# Patient Record
Sex: Male | Born: 1945 | Race: White | Hispanic: No | Smoking: Never smoker
Health system: Southern US, Community
[De-identification: ages and names within clinical notes are randomized; demographics above are authoritative.]

## PROBLEM LIST (undated history)

## (undated) DIAGNOSIS — G4730 Sleep apnea, unspecified: Secondary | ICD-10-CM

## (undated) DIAGNOSIS — F419 Anxiety disorder, unspecified: Secondary | ICD-10-CM

## (undated) DIAGNOSIS — K589 Irritable bowel syndrome without diarrhea: Secondary | ICD-10-CM

## (undated) DIAGNOSIS — F32A Depression, unspecified: Secondary | ICD-10-CM

## (undated) DIAGNOSIS — D649 Anemia, unspecified: Secondary | ICD-10-CM

## (undated) DIAGNOSIS — F429 Obsessive-compulsive disorder, unspecified: Secondary | ICD-10-CM

## (undated) DIAGNOSIS — I10 Essential (primary) hypertension: Secondary | ICD-10-CM

## (undated) DIAGNOSIS — E7800 Pure hypercholesterolemia, unspecified: Secondary | ICD-10-CM

## (undated) DIAGNOSIS — C61 Malignant neoplasm of prostate: Secondary | ICD-10-CM

## (undated) HISTORY — DX: Obsessive-compulsive disorder, unspecified: F42.9

## (undated) HISTORY — PX: TONSILECTOMY, ADENOIDECTOMY, BILATERAL MYRINGOTOMY AND TUBES: SHX2538

## (undated) HISTORY — DX: Pure hypercholesterolemia, unspecified: E78.00

## (undated) HISTORY — PX: COLONOSCOPY: SHX174

## (undated) HISTORY — DX: Anxiety disorder, unspecified: F41.9

## (undated) HISTORY — DX: Malignant neoplasm of prostate: C61

## (undated) HISTORY — DX: Essential (primary) hypertension: I10

## (undated) HISTORY — DX: Irritable bowel syndrome, unspecified: K58.9

---

## 2022-02-03 ENCOUNTER — Encounter: Payer: Self-pay | Admitting: Family Medicine

## 2022-02-03 ENCOUNTER — Ambulatory Visit (INDEPENDENT_AMBULATORY_CARE_PROVIDER_SITE_OTHER): Payer: Medicare Other | Admitting: Family Medicine

## 2022-02-03 VITALS — BP 128/88 | HR 113 | Temp 96.2°F | Ht 70.0 in | Wt 147.6 lb

## 2022-02-03 DIAGNOSIS — C61 Malignant neoplasm of prostate: Secondary | ICD-10-CM | POA: Diagnosis not present

## 2022-02-03 DIAGNOSIS — K4090 Unilateral inguinal hernia, without obstruction or gangrene, not specified as recurrent: Secondary | ICD-10-CM | POA: Diagnosis not present

## 2022-02-03 DIAGNOSIS — K581 Irritable bowel syndrome with constipation: Secondary | ICD-10-CM

## 2022-02-03 DIAGNOSIS — I10 Essential (primary) hypertension: Secondary | ICD-10-CM

## 2022-02-03 DIAGNOSIS — F429 Obsessive-compulsive disorder, unspecified: Secondary | ICD-10-CM

## 2022-02-03 DIAGNOSIS — R7309 Other abnormal glucose: Secondary | ICD-10-CM

## 2022-02-03 DIAGNOSIS — E7849 Other hyperlipidemia: Secondary | ICD-10-CM | POA: Diagnosis not present

## 2022-02-03 DIAGNOSIS — R109 Unspecified abdominal pain: Secondary | ICD-10-CM | POA: Diagnosis not present

## 2022-02-03 DIAGNOSIS — K589 Irritable bowel syndrome without diarrhea: Secondary | ICD-10-CM | POA: Insufficient documentation

## 2022-02-03 DIAGNOSIS — R300 Dysuria: Secondary | ICD-10-CM

## 2022-02-03 DIAGNOSIS — F419 Anxiety disorder, unspecified: Secondary | ICD-10-CM

## 2022-02-03 LAB — POCT URINALYSIS DIPSTICK
Bilirubin, UA: NEGATIVE
Blood, UA: NEGATIVE
Glucose, UA: NEGATIVE
Ketones, UA: NEGATIVE
Nitrite, UA: NEGATIVE
Protein, UA: POSITIVE — AB
Spec Grav, UA: >=1.03 — AB (ref 1.010–1.025)
Urobilinogen, UA: 0.2 E.U./dL
pH, UA: 5 (ref 5.0–8.0)

## 2022-02-03 NOTE — Patient Instructions (Signed)
Take Lorazepam 2-3 times /day ?

## 2022-02-03 NOTE — Progress Notes (Signed)
? ? ?Provider:  ?Alain Honey, MD ? ?Careteam: ?Patient Care Team: ?Wardell Honour, MD as PCP - General (Family Medicine) ? ?PLACE OF SERVICE:  ?[facility]  ?Advanced Directive information ?  ? ?No Known Allergies ? ?Chief Complaint  ?Patient presents with  ? New Patient (Initial Visit)  ?  Patient presents today for a new patient appointment.  ? Acute Visit  ?  Patient report lower abdominal pain/ pressure for over 4 year now.   ? ? ? ?HPI: Patient is a 76 y.o. male patient lives in [place] most of his life.  His wife died about 1-1/2 years ago and he has lived some in [place] but mostly here in [place] with his son and daughter-in-law. ? ?He has history of prostate cancer 6 years ago treated with radiation but no recent PSA. ? ?Also has a history of depression and OCD and sees Dr. Reece Levy here in [place] for that.  Current medications include sertraline and Abilify as well as lorazepam for anxiety. ? ?He has an chronic ongoing problems with obsessive-compulsive disorder worrying about his bowels as well as urine.  He does have a hernia on the left side.  We will get general surgery to see if repair might be indicated. ?He is not driving.  There is some instability as far as his feet as well as cognition. ? ?Review of Systems:  ?Review of Systems  ?Constitutional:  Positive for weight loss.  ?HENT: Negative.    ?Respiratory: Negative.    ?Cardiovascular: Negative.   ?Gastrointestinal:  Positive for constipation.  ?     Unsure if he is really constipated or it is really more of an obsession  ?Musculoskeletal: Negative.   ?Neurological: Negative.   ?Psychiatric/Behavioral:  Positive for depression and memory loss. The patient is nervous/anxious.   ?All other systems reviewed and are negative. ? ?Past Medical History:  ?Diagnosis Date  ? Anxiety   ? Per [place] new patient packet  ? High blood pressure   ? Per  ? High cholesterol   ? Per [place] new patient packet  ? IBS (irritable bowel syndrome)   ?  Per [place] new patient packet  ? OCD (obsessive compulsive disorder)   ? Per [place] new patient packet  ? Prostate cancer ([place])   ? Per [place] new patient packet  ? ?Past Surgical History:  ?Procedure Laterality Date  ? TONSILECTOMY, ADENOIDECTOMY, BILATERAL MYRINGOTOMY AND TUBES    ? Per [place] new patient packet  ? ?Social History: ?  reports that he has never smoked. He has never used smokeless tobacco. He reports that he does not drink alcohol and does not use drugs. ? ?Family History  ?Problem Relation Age of Onset  ? Dementia Mother   ? ? ?Medications: ?Patient's Medications  ?New Prescriptions  ? No medications on file  ?Previous Medications  ? ARIPIPRAZOLE (ABILIFY) 2 MG TABLET    Take 2 mg by mouth daily.  ? LORAZEPAM (ATIVAN) 0.5 MG TABLET    Take 0.5 mg by mouth every 8 (eight) hours.  ? METAMUCIL FIBER PO    Take 1 Dose by mouth daily.  ? SERTRALINE (ZOLOFT) 100 MG TABLET    Take 200 mg by mouth daily.  ? TAMSULOSIN (FLOMAX) 0.4 MG CAPS CAPSULE    Take 0.4 mg by mouth daily.  ?Modified Medications  ? No medications on file  ?Discontinued Medications  ? No medications on file  ? ? ?Physical Exam: ? ?Vitals:  ? 02/03/22 0844  ?  BP: 128/88  ?Pulse: (!) 113  ?Temp: (!) 96.2 ?F (35.7 ?C)  ?SpO2: 98%  ?Weight: 147 lb 9.6 oz (67 kg)  ?Height: '5\' 10"'$  (1.778 m)  ? ?Body mass index is 21.18 kg/m?. ?Wt Readings from Last 3 Encounters:  ?02/03/22 147 lb 9.6 oz (67 kg)  ? ? ?Physical Exam ?Vitals and nursing note reviewed.  ?Constitutional:   ?   Appearance: Normal appearance.  ?   Comments: Patient appears very fidgety during the course of the exam frequent mentioning of fear of obstruction of bowels and bladder  ?HENT:  ?   Head: Normocephalic.  ?   Right Ear: Tympanic membrane normal.  ?   Left Ear: Tympanic membrane normal.  ?Eyes:  ?   Extraocular Movements: Extraocular movements intact.  ?   Pupils: Pupils are equal, round, and reactive to light.  ?Cardiovascular:  ?   Rate and Rhythm: Normal rate and regular rhythm.  ?    Pulses: Normal pulses.  ?Pulmonary:  ?   Effort: Pulmonary effort is normal.  ?   Breath sounds: Normal breath sounds.  ?Abdominal:  ?   General: Abdomen is flat. Bowel sounds are normal.  ?   Palpations: Abdomen is soft.  ?Musculoskeletal:  ?   Cervical back: Normal range of motion.  ?Neurological:  ?   General: No focal deficit present.  ?   Mental Status: He is alert and oriented to person, place, and time.  ?   Comments: MMSE performed.  Patient scored 29 out of 30, but I feel like there is some soft evidence of cognitive impairment.  There is a general apathy that is unknown affected by the use of sertraline  ?Psychiatric:  ?   Comments: Behaviors are abnormal as noted above with restlessness and fidgeting and multiple mentions of relief of obstruction when exam does not support bowel blockage  ? ? ?Labs reviewed: ?Basic Metabolic Panel: ?No results for input(s): NA, K, CL, CO2, GLUCOSE, BUN, CREATININE, CALCIUM, MG, PHOS, TSH in the last 8760 hours. ?Liver Function Tests: ?No results for input(s): AST, ALT, ALKPHOS, BILITOT, PROT, ALBUMIN in the last 8760 hours. ?No results for input(s): LIPASE, AMYLASE in the last 8760 hours. ?No results for input(s): AMMONIA in the last 8760 hours. ?CBC: ?No results for input(s): WBC, NEUTROABS, HGB, HCT, MCV, PLT in the last 8760 hours. ?Lipid Panel: ?No results for input(s): CHOL, HDL, LDLCALC, TRIG, CHOLHDL, LDLDIRECT in the last 8760 hours. ?TSH: ?No results for input(s): TSH in the last 8760 hours. ?A1C: ?No results found for: HGBA1C ? ? ?Assessment/Plan ? ?1. Prostate cancer ([place]) ?Diagnosis 6 years ago.  Status post radiation treatments but no recent check of PSA ? ? ? ?3. Other hyperlipidemia ?History of hyperlipidemia but on no medications.  Need to establish baseline today ? ?4. Hernia, inguinal, left ?Exam demonstrates hernia.  Will refer to general surgery for their opinion ? ?5. Abdominal pain, unspecified abdominal location ?Abdominal exam is completely normal.   Complaints are chronic.  He is on MiraLAX and I have suggested increasing dose from 1 tablespoon to 2 table spoons or 3 tablespoons as needed ? ?6. Dysuria ?There are few white cells on urine today.  Culture it seems indicated ? ?7. Anxiety ?Anxiety demonstrated in examining room.  Have suggested increasing lorazepam 0.5 mg to 2 or 3 times a day ? ?8. Hypertension, unspecified type ?Blood pressure is good at 128/88 ? ?9. Irritable bowel syndrome with constipation ?Part of OCD.  Being treated  with sertraline 200 mg ? ?10. Obsessive-compulsive disorder, unspecified type ?As above treated with sertraline but clomipramine might be a better choice will confer with his psychiatry ? ? ?Alain Honey, MD ?[facility] ?[phone]  ? ?

## 2022-02-04 LAB — COMPLETE METABOLIC PANEL WITH GFR
AG Ratio: 2.3 (calc) (ref 1.0–2.5)
ALT: 8 U/L — ABNORMAL LOW (ref 9–46)
AST: 14 U/L (ref 10–35)
Albumin: 4.6 g/dL (ref 3.6–5.1)
Alkaline phosphatase (APISO): 53 U/L (ref 35–144)
BUN/Creatinine Ratio: 16 (calc) (ref 6–22)
BUN: 21 mg/dL (ref 7–25)
CO2: 26 mmol/L (ref 20–32)
Calcium: 9.3 mg/dL (ref 8.6–10.3)
Chloride: 104 mmol/L (ref 98–110)
Creat: 1.35 mg/dL — ABNORMAL HIGH (ref 0.70–1.28)
Globulin: 2 g/dL (calc) (ref 1.9–3.7)
Glucose, Bld: 95 mg/dL (ref 65–99)
Potassium: 3.9 mmol/L (ref 3.5–5.3)
Sodium: 141 mmol/L (ref 135–146)
Total Bilirubin: 0.5 mg/dL (ref 0.2–1.2)
Total Protein: 6.6 g/dL (ref 6.1–8.1)
eGFR: 55 mL/min/{1.73_m2} — ABNORMAL LOW (ref >=60)

## 2022-02-04 LAB — URINE CULTURE
MICRO NUMBER:: 13315905
Result:: NO GROWTH
SPECIMEN QUALITY:: ADEQUATE

## 2022-02-04 LAB — LIPID PANEL
Cholesterol: 211 mg/dL — ABNORMAL HIGH (ref <200)
HDL: 56 mg/dL (ref >=40)
LDL Cholesterol (Calc): 134 mg/dL (calc) — ABNORMAL HIGH
Non-HDL Cholesterol (Calc): 155 mg/dL (calc) — ABNORMAL HIGH (ref <130)
Total CHOL/HDL Ratio: 3.8 (calc) (ref <5.0)
Triglycerides: 99 mg/dL (ref <150)

## 2022-02-04 LAB — PSA, TOTAL AND FREE
PSA, % Free: 33 % (calc) (ref >25)
PSA, Free: 0.1 ng/mL
PSA, Total: 0.3 ng/mL (ref <=4.0)

## 2022-02-09 ENCOUNTER — Encounter (HOSPITAL_COMMUNITY): Payer: Self-pay

## 2022-02-09 ENCOUNTER — Other Ambulatory Visit: Payer: Self-pay

## 2022-02-09 ENCOUNTER — Emergency Department (HOSPITAL_COMMUNITY)
Admission: EM | Admit: 2022-02-09 | Discharge: 2022-02-10 | Disposition: A | Discharge status: Home / Self Care | Payer: Medicare Other | Attending: Emergency Medicine | Admitting: Emergency Medicine

## 2022-02-09 DIAGNOSIS — R4182 Altered mental status, unspecified: Secondary | ICD-10-CM | POA: Insufficient documentation

## 2022-02-09 DIAGNOSIS — Z8546 Personal history of malignant neoplasm of prostate: Secondary | ICD-10-CM | POA: Diagnosis not present

## 2022-02-09 DIAGNOSIS — R339 Retention of urine, unspecified: Secondary | ICD-10-CM | POA: Diagnosis present

## 2022-02-09 DIAGNOSIS — N179 Acute kidney failure, unspecified: Secondary | ICD-10-CM | POA: Insufficient documentation

## 2022-02-09 DIAGNOSIS — E860 Dehydration: Secondary | ICD-10-CM | POA: Diagnosis not present

## 2022-02-09 LAB — COMPREHENSIVE METABOLIC PANEL
ALT: 13 U/L (ref 0–44)
AST: 23 U/L (ref 15–41)
Albumin: 4.6 g/dL (ref 3.5–5.0)
Alkaline Phosphatase: 51 U/L (ref 38–126)
Anion gap: 10 (ref 5–15)
BUN: 24 mg/dL — ABNORMAL HIGH (ref 8–23)
CO2: 25 mmol/L (ref 22–32)
Calcium: 9.3 mg/dL (ref 8.9–10.3)
Chloride: 101 mmol/L (ref 98–111)
Creatinine, Ser: 1.5 mg/dL — ABNORMAL HIGH (ref 0.61–1.24)
GFR, Estimated: 49 mL/min — ABNORMAL LOW (ref >60)
Glucose, Bld: 98 mg/dL (ref 70–99)
Potassium: 4.1 mmol/L (ref 3.5–5.1)
Sodium: 136 mmol/L (ref 135–145)
Total Bilirubin: 1 mg/dL (ref 0.3–1.2)
Total Protein: 7.7 g/dL (ref 6.5–8.1)

## 2022-02-09 LAB — CBC
HCT: 44.1 % (ref 39.0–52.0)
Hemoglobin: 14.5 g/dL (ref 13.0–17.0)
MCH: 29.3 pg (ref 26.0–34.0)
MCHC: 32.9 g/dL (ref 30.0–36.0)
MCV: 89.1 fL (ref 80.0–100.0)
Platelets: 194 10*3/uL (ref 150–400)
RBC: 4.95 MIL/uL (ref 4.22–5.81)
RDW: 14.6 % (ref 11.5–15.5)
WBC: 10.8 10*3/uL — ABNORMAL HIGH (ref 4.0–10.5)
nRBC: 0 % (ref 0.0–0.2)

## 2022-02-09 LAB — URINALYSIS, ROUTINE W REFLEX MICROSCOPIC
Bilirubin Urine: NEGATIVE
Glucose, UA: NEGATIVE mg/dL
Hgb urine dipstick: NEGATIVE
Ketones, ur: 5 mg/dL — AB
Leukocytes,Ua: NEGATIVE
Nitrite: NEGATIVE
Protein, ur: NEGATIVE mg/dL
Specific Gravity, Urine: 1.013 (ref 1.005–1.030)
pH: 5 (ref 5.0–8.0)

## 2022-02-09 NOTE — ED Provider Triage Note (Signed)
Emergency Medicine Provider Triage Evaluation Note ? ?Miachel Roux , a 76 y.o. male  was evaluated in triage.  Pt complains of difficulty with urinating and diarrhea. Pt states he has been having constipation and started taking miralax multiple times per day and giving himself diarrhea. No blood in stool. He's complaining of difficulty starting urinary stream and emptying. Hx of prostate cancer requiring radiation.  ? ?Review of Systems  ?Positive: As above ?Negative: Fever, N/V, hematuria ? ?Physical Exam  ?BP (!) 141/87 (BP Location: Left Arm)   Pulse (!) 101   Temp 97.8 ?F (36.6 ?C) (Oral)   Resp 16   SpO2 97%  ?Gen:   Awake, no distress   ?Resp:  Normal effort  ?MSK:   Moves extremities without difficulty  ?Other:   ? ?Medical Decision Making  ?Medically screening exam initiated at 9:39 PM.  Appropriate orders placed.  Braydin Aloi was informed that the remainder of the evaluation will be completed by another provider, this initial triage assessment does not replace that evaluation, and the importance of remaining in the ED until their evaluation is complete. ? ? ?  ?Mayur Duman T, PA-C ?02/09/22 2140 ? ?

## 2022-02-09 NOTE — ED Triage Notes (Signed)
Pt BIB EMS. Pt has a hx of thinking that he has urinary retention and bowel blockage due to his anxiety from prostate cancer per his son. Pt had a recent appt and does not have urinary retention or a bowel blockage.  ?

## 2022-02-10 ENCOUNTER — Emergency Department (HOSPITAL_COMMUNITY): Payer: Medicare Other

## 2022-02-10 DIAGNOSIS — E860 Dehydration: Secondary | ICD-10-CM | POA: Diagnosis not present

## 2022-02-10 NOTE — ED Notes (Signed)
Bladder scanned pt, pt has 152 mL of urine retained in bladder. ?

## 2022-02-10 NOTE — ED Provider Notes (Signed)
?[facility] DEPT ?Provider Note ? ? ?CSN: [id number] ?Arrival date & time: 02/09/22  2118 ? ?  ? ?History ? ?Chief Complaint  ?Patient presents with  ? Urinary Retention  ? ? ?Kenneth Cunningham is a 76 y.o. male. ? ?76 year old male without significant past medical history who presents to the ER today secondary to concern for urinary or bowel obstruction.  Apparently has been perseverating on the concerned that he might get his bowels or urine blocked up.  He does have a history of prostate cancer status post radiation.  He feels like he is urinating too often.  He does not feel he is emptying his bladder.  He states he feels like he is constipated a while back to start taking MiraLAX every day but now is concerned because he is having multiple diarrhea episodes (67) most days.  Establish care with a primary doctor about a week ago who said his kidney function was a bit off which is gotten more worried.  Son states that he also has been having some intermittent episodes of balance problems especially when he is outside or standing up for a long time.  Also states that he had an episode earlier tonight where he was confused and did not know for sure what room he was then or what to do in certain rooms to include the bathroom.  No other neurologic changes.  Patient has memory issues and on reviewing the note from his primary doctor I suspect the patient might have some early dementia.  Also has increased stress in his life since his wife passed away about a year and a half ago when she was the one that took care of him.  He is now staying with his son. ? ? ? ?  ? ?Home Medications ?Prior to Admission medications   ?Medication Sig Start Date End Date Taking? Authorizing Provider  ?LORazepam (ATIVAN) 0.5 MG tablet Take 0.5 mg by mouth daily as needed for anxiety.   Yes [provider]  ?risperiDONE (RISPERDAL) 0.25 MG tablet Take 0.25 mg by mouth at bedtime. 02/04/22  Yes [provider]  ?sertraline (ZOLOFT) 100 MG tablet Take 200 mg by mouth at bedtime.   Yes [provider]  ?tamsulosin (FLOMAX) 0.4 MG CAPS capsule Take 0.4 mg by mouth every evening.   Yes [provider]  ?   ? ?Allergies    ?Patient has no known allergies.   ? ?Review of Systems   ?Review of Systems ? ?Physical Exam ?Updated Vital Signs ?BP 112/76   Pulse 81   Temp 97.8 ?F (36.6 ?C) (Oral)   Resp 16   SpO2 99%  ?Physical Exam ?Vitals and nursing note reviewed.  ?Constitutional:   ?   Appearance: He is well-developed.  ?HENT:  ?   Head: Normocephalic and atraumatic.  ?   Mouth/Throat:  ?   Mouth: Mucous membranes are moist.  ?   Pharynx: Oropharynx is clear.  ?Eyes:  ?   Pupils: Pupils are equal, round, and reactive to light.  ?Cardiovascular:  ?   Rate and Rhythm: Normal rate.  ?Pulmonary:  ?   Effort: Pulmonary effort is normal. No respiratory distress.  ?Abdominal:  ?   General: Abdomen is flat. There is no distension.  ?   Tenderness: There is no abdominal tenderness.  ?Genitourinary: ?   Penis: Normal.   ?   Testes: Normal.  ?Musculoskeletal:     ?   General: No swelling. Normal  range of motion.  ?   Cervical back: Normal range of motion.  ?Skin: ?   General: Skin is warm and dry.  ?Neurological:  ?   General: No focal deficit present.  ?   Mental Status: He is alert.  ? ? ?ED Results / Procedures / Treatments   ?Labs ?(all labs ordered are listed, but only abnormal results are displayed) ?Labs Reviewed  ?CBC - Abnormal; Notable for the following components:  ?    Result Value  ? WBC 10.8 (*)   ? All other components within normal limits  ?COMPREHENSIVE METABOLIC PANEL - Abnormal; Notable for the following components:  ? BUN 24 (*)   ? Creatinine, Ser 1.50 (*)   ? GFR, Estimated 49 (*)   ? All other components within normal limits  ?URINALYSIS, ROUTINE W REFLEX MICROSCOPIC - Abnormal; Notable for the following components:  ? Ketones, ur 5 (*)   ? All other components within normal limits   ? ? ?EKG ?None ? ?Radiology ?CT Head Wo Contrast ? ?Result Date: 02/10/2022 ?CLINICAL DATA:  Altered mental status EXAM: CT HEAD WITHOUT CONTRAST TECHNIQUE: Contiguous axial images were obtained from the base of the skull through the vertex without intravenous contrast. RADIATION DOSE REDUCTION: This exam was performed according to the departmental dose-optimization program which includes automated exposure control, adjustment of the mA and/or kV according to patient size and/or use of iterative reconstruction technique. COMPARISON:  None Available. FINDINGS: Brain: No evidence of acute infarction, hemorrhage, hydrocephalus, extra-axial collection or mass lesion/mass effect. Vascular: No hyperdense vessel or unexpected calcification. Skull: Normal. Negative for fracture or focal lesion. Sinuses/Orbits: No acute finding. Other: None. IMPRESSION: No acute intracranial abnormality noted. Electronically Signed   By: Inez Catalina M.D.   On: 02/10/2022 03:38   ? ?Procedures ?Procedures  ? ? ?Medications Ordered in ED ?Medications - No data to display ? ?ED Course/ Medical Decision Making/ A&P ?  ?                        ?Medical Decision Making ?Amount and/or Complexity of Data Reviewed ?Radiology: ordered. ? ? ?Bladder scan with ~150 post void. Urine clear. Does have mild AKI. Suspect dehydration as cause for multiple symptoms. Does have a history prostate cancer, so head CT done to evaluate for any type of metastatic disease. Doesn't have any signs of cauda equina. ? ?Reassuring.  Patient is stable for discharge.  Urology referral placed.  Return here for new or worsening symptoms. ?  ?Final Clinical Impression(s) / ED Diagnoses ?Final diagnoses:  ?Dehydration  ? ? ?Rx / DC Orders ?ED Discharge Orders   ? ?      Ordered  ?  Ambulatory referral to Urology       ? 02/10/22 0437  ? ?  ?  ? ?  ? ? ?  ?[name], MD ?02/10/22 [phone] ? ?

## 2022-02-15 ENCOUNTER — Other Ambulatory Visit: Payer: Self-pay

## 2022-02-15 ENCOUNTER — Emergency Department (HOSPITAL_COMMUNITY)
Admission: EM | Admit: 2022-02-15 | Discharge: 2022-02-17 | Disposition: A | Discharge status: Other Acute Inpatient Hospital | Payer: Medicare Other | Attending: Emergency Medicine | Admitting: Emergency Medicine

## 2022-02-15 ENCOUNTER — Encounter (HOSPITAL_COMMUNITY): Payer: Self-pay

## 2022-02-15 DIAGNOSIS — Z8546 Personal history of malignant neoplasm of prostate: Secondary | ICD-10-CM | POA: Insufficient documentation

## 2022-02-15 DIAGNOSIS — Z046 Encounter for general psychiatric examination, requested by authority: Secondary | ICD-10-CM | POA: Diagnosis present

## 2022-02-15 DIAGNOSIS — R339 Retention of urine, unspecified: Secondary | ICD-10-CM | POA: Diagnosis not present

## 2022-02-15 DIAGNOSIS — Y900 Blood alcohol level of less than 20 mg/100 ml: Secondary | ICD-10-CM | POA: Diagnosis not present

## 2022-02-15 DIAGNOSIS — F22 Delusional disorders: Secondary | ICD-10-CM | POA: Diagnosis not present

## 2022-02-15 DIAGNOSIS — Z79899 Other long term (current) drug therapy: Secondary | ICD-10-CM | POA: Diagnosis not present

## 2022-02-15 DIAGNOSIS — F429 Obsessive-compulsive disorder, unspecified: Secondary | ICD-10-CM | POA: Insufficient documentation

## 2022-02-15 DIAGNOSIS — Z20822 Contact with and (suspected) exposure to covid-19: Secondary | ICD-10-CM | POA: Insufficient documentation

## 2022-02-15 LAB — COMPREHENSIVE METABOLIC PANEL
ALT: 14 U/L (ref 0–44)
AST: 22 U/L (ref 15–41)
Albumin: 4.7 g/dL (ref 3.5–5.0)
Alkaline Phosphatase: 51 U/L (ref 38–126)
Anion gap: 13 (ref 5–15)
BUN: 17 mg/dL (ref 8–23)
CO2: 21 mmol/L — ABNORMAL LOW (ref 22–32)
Calcium: 9.3 mg/dL (ref 8.9–10.3)
Chloride: 103 mmol/L (ref 98–111)
Creatinine, Ser: 1.14 mg/dL (ref 0.61–1.24)
GFR, Estimated: >60 mL/min (ref >60)
Glucose, Bld: 103 mg/dL — ABNORMAL HIGH (ref 70–99)
Potassium: 3.7 mmol/L (ref 3.5–5.1)
Sodium: 137 mmol/L (ref 135–145)
Total Bilirubin: 1.2 mg/dL (ref 0.3–1.2)
Total Protein: 7.5 g/dL (ref 6.5–8.1)

## 2022-02-15 LAB — URINALYSIS, ROUTINE W REFLEX MICROSCOPIC
Bilirubin Urine: NEGATIVE
Glucose, UA: NEGATIVE mg/dL
Hgb urine dipstick: NEGATIVE
Ketones, ur: NEGATIVE mg/dL
Leukocytes,Ua: NEGATIVE
Nitrite: NEGATIVE
Protein, ur: NEGATIVE mg/dL
Specific Gravity, Urine: 1.014 (ref 1.005–1.030)
pH: 5 (ref 5.0–8.0)

## 2022-02-15 LAB — CBC WITH DIFFERENTIAL/PLATELET
Abs Immature Granulocytes: 0.05 10*3/uL (ref 0.00–0.07)
Basophils Absolute: 0 10*3/uL (ref 0.0–0.1)
Basophils Relative: 1 %
Eosinophils Absolute: 0 10*3/uL (ref 0.0–0.5)
Eosinophils Relative: 0 %
HCT: 43.7 % (ref 39.0–52.0)
Hemoglobin: 14.5 g/dL (ref 13.0–17.0)
Immature Granulocytes: 1 %
Lymphocytes Relative: 10 %
Lymphs Abs: 0.8 10*3/uL (ref 0.7–4.0)
MCH: 29.4 pg (ref 26.0–34.0)
MCHC: 33.2 g/dL (ref 30.0–36.0)
MCV: 88.5 fL (ref 80.0–100.0)
Monocytes Absolute: 0.7 10*3/uL (ref 0.1–1.0)
Monocytes Relative: 9 %
Neutro Abs: 5.7 10*3/uL (ref 1.7–7.7)
Neutrophils Relative %: 79 %
Platelets: 156 10*3/uL (ref 150–400)
RBC: 4.94 MIL/uL (ref 4.22–5.81)
RDW: 14.8 % (ref 11.5–15.5)
WBC: 7.2 10*3/uL (ref 4.0–10.5)
nRBC: 0 % (ref 0.0–0.2)

## 2022-02-15 LAB — RAPID URINE DRUG SCREEN, HOSP PERFORMED
Amphetamines: NOT DETECTED
Barbiturates: NOT DETECTED
Benzodiazepines: NOT DETECTED
Cocaine: NOT DETECTED
Opiates: NOT DETECTED
Tetrahydrocannabinol: NOT DETECTED

## 2022-02-15 LAB — RESP PANEL BY RT-PCR (FLU A&B, COVID) ARPGX2
Influenza A by PCR: NEGATIVE
Influenza B by PCR: NEGATIVE
SARS Coronavirus 2 by RT PCR: NEGATIVE

## 2022-02-15 LAB — ETHANOL: Alcohol, Ethyl (B): <10 mg/dL (ref <10)

## 2022-02-15 MED ORDER — LORAZEPAM 0.5 MG PO TABS
0.5000 mg | ORAL_TABLET | Freq: Every day | ORAL | Status: DC | PRN
  Administered 2022-02-15 – 2022-02-16 (×2): 0.5 mg via ORAL
  Filled 2022-02-15 (×2): qty 1

## 2022-02-15 MED ORDER — RISPERIDONE 0.5 MG PO TABS
0.2500 mg | ORAL_TABLET | Freq: Every day | ORAL | Status: DC
  Administered 2022-02-15 – 2022-02-16 (×2): 0.25 mg via ORAL
  Filled 2022-02-15 (×2): qty 1

## 2022-02-15 MED ORDER — ACETAMINOPHEN 325 MG PO TABS: 650.0000 mg | ORAL_TABLET | ORAL | Status: DC | PRN

## 2022-02-15 MED ORDER — TAMSULOSIN HCL 0.4 MG PO CAPS
0.4000 mg | ORAL_CAPSULE | Freq: Every evening | ORAL | Status: DC
  Administered 2022-02-15 – 2022-02-16 (×2): 0.4 mg via ORAL
  Filled 2022-02-15 (×2): qty 1

## 2022-02-15 MED ORDER — SERTRALINE HCL 50 MG PO TABS
200.0000 mg | ORAL_TABLET | Freq: Every day | ORAL | Status: DC
Start: 2022-02-15 — End: 2022-02-17
  Administered 2022-02-15 – 2022-02-16 (×2): 200 mg via ORAL
  Filled 2022-02-15 (×2): qty 4

## 2022-02-15 NOTE — ED Notes (Signed)
Updated patients daughter in law, [place] on the recommendation of geri psych placement. She said if she can please be updated along the way with where the patients placement will be.  ?

## 2022-02-15 NOTE — BH Assessment (Signed)
Comprehensive Clinical Assessment (CCA) Note  02/15/2022 Kenneth Cunningham [id number]  Discharge Disposition: Roselyn Bering, NP, reviewed pt's chart and information and determined pt meets geri psych hospitalization criteria. Pt's referral information will be faxed out to multiple hospitals for potential placement. This information was relayed to pt's team at [phone].  The patient demonstrates the following risk factors for suicide: Chronic risk factors for suicide include: psychiatric disorder of OCD . Acute risk factors for suicide include: family or marital conflict, unemployment, social withdrawal/isolation, and loss (financial, interpersonal, professional). Protective factors for this patient include: positive social support and positive therapeutic relationship. Considering these factors, the overall suicide risk at this point appears to be low. Patient is not appropriate for outpatient follow up.  Therefore, a 1:2 sitter is recommended for suicide precautions.  Flowsheet Row ED from 02/15/2022 in [facility] ED from 02/09/2022 in [facility]  C-SSRS RISK CATEGORY Low Risk No Risk     Chief Complaint:  Chief Complaint  Patient presents with   Urinary Retention   Visit Diagnosis: OCD  CCA Screening, Triage and Referral (STR) Kenneth Cunningham is a 76 year old patient who was brought to the [facility] by EMS due to concern that he had a blockage. Pt states, "My digestive track is screwed up. I think that nobody has taken me really serious on this. I've said I have a bladder blockage and a colon blockage. I don't think anyone has checked that. My colon and my intestines... I think my digestive track has got problems. I've got so much anxiety. I'm driving my family crazy."   Pt acknowledges he's been experiencing SI, though he denies he's ever attempted to kill himself or that he has a plan to kill himself. Pt denies HI, AVH, NSSIB, access to  guns/weapons, engagemetn with the legal system, or SA.  Pt is oriented x5. His recent/remote memory is intact. Of note, when asked of his marital status, pt stated he was married; it is this clinician's belief that pt is aware he is widowed but did not want to discuss this. Pt was cooperative throughout the assessment process. Pt's insight, judgement, and impulse control is impaired at this time.  Patient Reported Information How did you hear about [place]? Family/Friend  What Is the Reason for Your Visit/Call Today? Pt states, "My digestive track is screwed up. I think that nobody has taken me really serious on this. I've said I have a bladder blockage and a colon blockage. I don't think anyone has checked that. My colon and my intestines... I think my digestive track has got problems. I've got so much anxiety. I'm driving my family crazy." Pt acknowledges he's been experiencing SI, though he denies he's ever attempted to kill himself or that he has a plan to kill himself. Pt denies HI, AVH, NSSIB, access to guns/weapons, engagemetn with the legal system, or SA.  How Long Has This Been Causing You Problems? > than 6 months  What Do You Feel Would Help You the Most Today? Medication(s); Treatment for Depression or other mood problem   Have You Recently Had Any Thoughts About Hurting Yourself? Yes  Are You Planning to Commit Suicide/Harm Yourself At This time? No   Have you Recently Had Thoughts About Hurting Someone Karolee Ohs? No  Are You Planning to Harm Someone at This Time? No  Explanation: No data recorded  Have You Used Any Alcohol or Drugs in the Past 24 Hours? No  How Long Ago Did You  Use Drugs or Alcohol? No data recorded What Did You Use and How Much? No data recorded  Do You Currently Have a Therapist/Psychiatrist? Yes  Name of Therapist/Psychiatrist: Dr. Betti Cruz, psychiatrist   Have You Been Recently Discharged From Any Office Practice or Programs? No  Explanation of Discharge  From Practice/Program: No data recorded    CCA Screening Triage Referral Assessment Type of Contact: Tele-Assessment  Telemedicine Service Delivery: Telemedicine service delivery: This service was provided via telemedicine using a 2-way, interactive audio and video technology  Is this Initial or Reassessment? Initial Assessment  Date Telepsych consult ordered in [facility]:  02/15/22  Time Telepsych consult ordered in [facility]:  0527  Location of Assessment: [facility] ED  Provider Location: [facility] Assessment Services   Collateral Involvement: None currently   Does Patient Have a Automotive engineer Guardian? No data recorded Name and Contact of Legal Guardian: No data recorded If Minor and Not Living with Parent(s), Who has Custody? N/A  Is CPS involved or ever been involved? Never  Is APS involved or ever been involved? Never   Patient Determined To Be At Risk for Harm To Self or Others Based on Review of Patient Reported Information or Presenting Complaint? No  Method: No data recorded Availability of Means: No data recorded Intent: No data recorded Notification Required: No data recorded Additional Information for Danger to Others Potential: No data recorded Additional Comments for Danger to Others Potential: No data recorded Are There Guns or Other Weapons in Your Home? No data recorded Types of Guns/Weapons: No data recorded Are These Weapons Safely Secured?                            No data recorded Who Could Verify You Are Able To Have These Secured: No data recorded Do You Have any Outstanding Charges, Pending Court Dates, Parole/Probation? No data recorded Contacted To Inform of Risk of Harm To Self or Others: -- (N/A)    Does Patient Present under Involuntary Commitment? No  IVC Papers Initial File Date: No data recorded  [place] of Residence: Other (Comment) (Pt is currently staying with his son but has a house in [place], [place])   Patient Currently Receiving the  Following Services: Medication Management   Determination of Need: Emergent (2 hours)   Options For Referral: Medication Management; Outpatient Therapy; Geropsychiatric Facility     CCA Biopsychosocial Patient Reported Schizophrenia/Schizoaffective Diagnosis in Past: No   Strengths: Pt has good family support. He has consistent housing and medical care. Pt is aware he is in need of assistance with behavioral health.   Mental Health Symptoms Depression:   Fatigue; Hopelessness; Worthlessness; Sleep (too much or little); Difficulty Concentrating   Duration of Depressive symptoms:  Duration of Depressive Symptoms: Greater than two weeks   Mania:   None   Anxiety:    Worrying; Tension; Sleep; Restlessness; Fatigue; Difficulty concentrating   Psychosis:   Delusions   Duration of Psychotic symptoms:  Duration of Psychotic Symptoms: Greater than six months   Trauma:   None   Obsessions:   None   Compulsions:   None   Inattention:   None   Hyperactivity/Impulsivity:   None   Oppositional/Defiant Behaviors:   None   Emotional Irregularity:   Chronic feelings of emptiness; Unstable self-image   Other Mood/Personality Symptoms:   None noted    Mental Status Exam Appearance and self-care  Stature:   Average   Weight:  Average weight   Clothing:   -- [facility] scrubs)   Grooming:   Normal   Cosmetic use:   None   Posture/gait:   Normal   Motor activity:   Not Remarkable   Sensorium  Attention:   Normal   Concentration:   Normal   Orientation:   X5   Recall/memory:   Normal   Affect and Mood  Affect:   Blunted; Flat   Mood:   Depressed   Relating  Eye contact:   Avoided   Facial expression:   Anxious   Attitude toward examiner:   Guarded; Cooperative   Thought and Language  Speech flow:  Clear and Coherent   Thought content:   Appropriate to Mood and Circumstances   Preoccupation:   Somatic    Hallucinations:   None   Organization:  No data recorded  [employer] of Knowledge:   Good   Intelligence:   Above Average   Abstraction:   Abstract   Judgement:   Impaired   Reality Testing:   Distorted   Insight:   Gaps   Decision Making:   Normal   Social Functioning  Social Maturity:   Isolates   Social Judgement:   Naive   Stress  Stressors:   Family conflict; Grief/losses; Housing; Illness; Relationship; Transitions   Coping Ability:   Deficient supports; Overwhelmed   Skill Deficits:   Communication; Self-care   Supports:   Family; Friends/Service system     Religion: Religion/Spirituality Are You A Religious Person?: Yes What is Your Religious Affiliation?: Methodist How Might This Affect Treatment?: Not assessed  Leisure/Recreation: Leisure / Recreation Do You Have Hobbies?:  (Not assessed)  Exercise/Diet: Exercise/Diet Do You Exercise?:  (Not assessed) Have You Gained or Lost A Significant Amount of Weight in the Past Six Months?:  (Not assessed) Do You Follow a Special Diet?:  (Not assessed) Do You Have Any Trouble Sleeping?: Yes Explanation of Sleeping Difficulties: Pt shares he has difficulties falling and staying asleep   CCA Employment/Education Employment/Work Situation: Employment / Work Psychologist, occupational Employment Situation: Retired Passenger transport manager has Been Impacted by Current Illness: No Has Patient ever Been in Equities trader?: No  Education: Education Is Patient Currently Attending School?: No Last Grade Completed: 16 Did You Product manager?: Yes What Type of College Degree Do you Have?: Pt has a bachelor's degree in Business Did You Have An Individualized Education Program (IIEP): No Did You Have Any Difficulty At School?: No Patient's Education Has Been Impacted by Current Illness: No   CCA Family/Childhood History Family and Relationship History: Family history Marital status: Widowed Widowed, when?:  18 months ago Does patient have children?: Yes How many children?: 1 How is patient's relationship with their children?: Pt is currently living with his son and daughter-in-law  Childhood History:  Childhood History By whom was/is the patient raised?:  (Not assessed) Did patient suffer any verbal/emotional/physical/sexual abuse as a child?:  (Not assessed) Did patient suffer from severe childhood neglect?:  (Not assessed) Has patient ever been sexually abused/assaulted/raped as an adolescent or adult?:  (Not assessed) Was the patient ever a victim of a crime or a disaster?:  (Not assessed) Witnessed domestic violence?:  (Not assessed) Has patient been affected by domestic violence as an adult?:  (Not assessed)  Child/Adolescent Assessment:     CCA Substance Use Alcohol/Drug Use: Alcohol / Drug Use Pain Medications: See MAR Prescriptions: See MAR Over the Counter: See MAR History of alcohol / drug use?: No  history of alcohol / drug abuse Longest period of sobriety (when/how long): N/A Negative Consequences of Use:  (N/A) Withdrawal Symptoms:  (N/A)                         ASAM's:  Six Dimensions of Multidimensional Assessment  Dimension 1:  Acute Intoxication and/or Withdrawal Potential:      Dimension 2:  Biomedical Conditions and Complications:      Dimension 3:  Emotional, Behavioral, or Cognitive Conditions and Complications:     Dimension 4:  Readiness to Change:     Dimension 5:  Relapse, Continued use, or Continued Problem Potential:     Dimension 6:  Recovery/Living Environment:     ASAM Severity Score:    ASAM Recommended Level of Treatment: ASAM Recommended Level of Treatment:  (N/A)   Substance use Disorder (SUD) Substance Use Disorder (SUD)  Checklist Symptoms of Substance Use:  (N/A)  Recommendations for Services/Supports/Treatments: Recommendations for Services/Supports/Treatments Recommendations For Services/Supports/Treatments: Individual  Therapy, Medication Management [facility])  Discharge Disposition: Roselyn Bering, NP, reviewed pt's chart and information and determined pt meets geri psych hospitalization criteria. Pt's referral information will be faxed out to multiple hospitals for potential placement. This information was relayed to pt's team at [phone].  DSM5 Diagnoses: Patient Active Problem List   Diagnosis Date Noted   Anxiety    High blood pressure    IBS (irritable bowel syndrome)    OCD (obsessive compulsive disorder)    Prostate cancer (HCC)      Referrals to Alternative Service(s): Referred to Alternative Service(s):   Place:   Date:   Time:    Referred to Alternative Service(s):   Place:   Date:   Time:    Referred to Alternative Service(s):   Place:   Date:   Time:    Referred to Alternative Service(s):   Place:   Date:   Time:     Ralph Dowdy, LMFT

## 2022-02-15 NOTE — ED Triage Notes (Signed)
Patient BIB [facility] from home. He thinks he is dehydrated, complaining of dry mouth. Has not been drinking because he thinks he has been retaining his urine.  ?

## 2022-02-15 NOTE — Progress Notes (Signed)
Patient has been faxed out due to no appropriate beds available. Patient meets [facility] inpatient criteria per Quintella Reichert, NP. Patient has been faxed to the following facilities:  ? ?[facility]  [address] [place] [address] [phone]  ?[place], [place] [address] [phone]  ?[facility]  [place], [place] [address] [phone]  ?[facility]  [address] [place] [address] [phone]  ?[facility]  [address] [place], [place] [address] [phone]  ?[facility]  [place], [place] [address] [phone]  ?[facility]  [address] [place] [address] [phone]  ?[facility]  [address], [place] [address] [phone]  ?[facility]  [place] [place] [address] [phone]  ?[facility]  [address], [place] [address] [phone]  ?[facility]  [address] [place] [address] [phone]  ?[facility]  [address], [place] [address] [phone]  ?[place], [place] [address] [phone]  ?[facility]  [place], [place] [address] [phone]  ?[facility]  [address] [place] [address] [phone]  ?[facility] [address] [place] [address] [phone]  ? ?Mariea Clonts, MSW, LCSW-A  ?8:00 PM 02/15/2022   ?

## 2022-02-15 NOTE — Progress Notes (Signed)
Patient has declined [employer] placement. CSW will continue to seek recommended disposition.  ? ?Mariea Clonts, MSW, LCSW-A  ?8:52 PM 02/15/2022   ?

## 2022-02-15 NOTE — ED Provider Notes (Addendum)
[facility]-EMERGENCY DEPT Provider Note: Lowella Dell, MD, FACEP  CSN: [id number] MRN: [id number] ARRIVAL: 02/15/22 at 0332 ROOM: WA15/WA15   CHIEF COMPLAINT  Urinary Retention   HISTORY OF PRESENT ILLNESS  02/15/22 3:42 AM Kenneth Cunningham is a 76 y.o. male who states he is retaining urine.  He is having significant discomfort in his bladder.  He seems distracted by the discomfort and is reticent to answer further questions.  EMS reports he had a death of his spouse and has had some obsessive-compulsive behaviors associated with his grief.  They believe he has not been urinating because he has not been drinking.   Past Medical History:  Diagnosis Date   Anxiety    Per [facility] new patient packet   High blood pressure    Per   High cholesterol    Per [facility] new patient packet   IBS (irritable bowel syndrome)    Per [facility] new patient packet   OCD (obsessive compulsive disorder)    Per [facility] new patient packet   Prostate cancer (HCC)    Per [facility] new patient packet    Past Surgical History:  Procedure Laterality Date   TONSILECTOMY, ADENOIDECTOMY, BILATERAL MYRINGOTOMY AND TUBES     Per [facility] new patient packet    Family History  Problem Relation Age of Onset   Dementia Mother     Social History   Tobacco Use   Smoking status: Never   Smokeless tobacco: Never  Vaping Use   Vaping Use: Never used  Substance Use Topics   Alcohol use: Never   Drug use: Never    Prior to Admission medications   Medication Sig Start Date End Date Taking? Authorizing Provider  LORazepam (ATIVAN) 0.5 MG tablet Take 0.5 mg by mouth daily as needed for anxiety.    [provider]  risperiDONE (RISPERDAL) 0.25 MG tablet Take 0.25 mg by mouth at bedtime. 02/04/22   [provider]  sertraline (ZOLOFT) 100 MG tablet Take 200 mg by mouth at bedtime.    [provider]  tamsulosin (FLOMAX) 0.4 MG CAPS capsule Take 0.4 mg by mouth every evening.    [provider]     Allergies Patient has no known allergies.   REVIEW OF SYSTEMS  Negative except as noted here or in the History of Present Illness.   PHYSICAL EXAMINATION  Initial Vital Signs Blood pressure (!) 141/94, pulse (!) 108, temperature 98.2 F (36.8 C), temperature source Oral, resp. rate 20, height 5\' 10"  (1.778 m), weight 67 kg, SpO2 99 %.  Examination General: Well-developed, well-nourished male in no acute distress; appearance consistent with age of record HENT: normocephalic; atraumatic Eyes: Normal appearance Neck: supple Heart: regular rate and rhythm Lungs: clear to auscultation bilaterally Abdomen: soft; nondistended; suprapubic tenderness without distention palpated; bowel sounds present GU: Tanner V male, circumcised; bilateral inguinal hernia left greater than right Extremities: No deformity; full range of motion; pulses normal Neurologic: Awake, alert; motor function intact in all extremities and symmetric; no facial droop Skin: Warm and dry Psychiatric: Poor eye contact; for answers to questions; mildly agitated   RESULTS  Summary of this visit's results, reviewed and interpreted by myself:   EKG Interpretation  Date/Time:  Monday Feb 15 2022 05:42:32 EDT Ventricular Rate:  98 PR Interval:  154 QRS Duration: 87 QT Interval:  342 QTC Calculation: 437 R Axis:   83 Text Interpretation: Sinus rhythm Borderline right axis deviation No previous ECGs available Confirmed by Tashanti Dalporto ([id number]) on 02/15/2022  5:56:14 AM       Laboratory Studies: Results for orders placed or performed during the hospital encounter of 02/15/22 (from the past 24 hour(s))  Urinalysis, Routine w reflex microscopic Urine, Clean Catch     Status: None   Collection Time: 02/15/22  3:57 AM  Result Value Ref Range   Color, Urine YELLOW YELLOW   APPearance CLEAR CLEAR   Specific Gravity, Urine 1.014 1.005 - 1.030   pH 5.0 5.0 - 8.0   Glucose, UA NEGATIVE NEGATIVE mg/dL   Hgb urine  dipstick NEGATIVE NEGATIVE   Bilirubin Urine NEGATIVE NEGATIVE   Ketones, ur NEGATIVE NEGATIVE mg/dL   Protein, ur NEGATIVE NEGATIVE mg/dL   Nitrite NEGATIVE NEGATIVE   Leukocytes,Ua NEGATIVE NEGATIVE  Rapid urine drug screen (hospital performed)     Status: None   Collection Time: 02/15/22  3:57 AM  Result Value Ref Range   Opiates NONE DETECTED NONE DETECTED   Cocaine NONE DETECTED NONE DETECTED   Benzodiazepines NONE DETECTED NONE DETECTED   Amphetamines NONE DETECTED NONE DETECTED   Tetrahydrocannabinol NONE DETECTED NONE DETECTED   Barbiturates NONE DETECTED NONE DETECTED  CBC with Differential     Status: None   Collection Time: 02/15/22  5:26 AM  Result Value Ref Range   WBC 7.2 4.0 - 10.5 K/uL   RBC 4.94 4.22 - 5.81 MIL/uL   Hemoglobin 14.5 13.0 - 17.0 g/dL   HCT 16.1 09.6 - 04.5 %   MCV 88.5 80.0 - 100.0 fL   MCH 29.4 26.0 - 34.0 pg   MCHC 33.2 30.0 - 36.0 g/dL   RDW 40.9 81.1 - 91.4 %   Platelets 156 150 - 400 K/uL   nRBC 0.0 0.0 - 0.2 %   Neutrophils Relative % 79 %   Neutro Abs 5.7 1.7 - 7.7 K/uL   Lymphocytes Relative 10 %   Lymphs Abs 0.8 0.7 - 4.0 K/uL   Monocytes Relative 9 %   Monocytes Absolute 0.7 0.1 - 1.0 K/uL   Eosinophils Relative 0 %   Eosinophils Absolute 0.0 0.0 - 0.5 K/uL   Basophils Relative 1 %   Basophils Absolute 0.0 0.0 - 0.1 K/uL   Immature Granulocytes 1 %   Abs Immature Granulocytes 0.05 0.00 - 0.07 K/uL  Resp Panel by RT-PCR (Flu A&B, Covid) Nasopharyngeal Swab     Status: None   Collection Time: 02/15/22  5:28 AM   Specimen: Nasopharyngeal Swab; Nasopharyngeal(NP) swabs in vial transport medium  Result Value Ref Range   SARS Coronavirus 2 by RT PCR NEGATIVE NEGATIVE   Influenza A by PCR NEGATIVE NEGATIVE   Influenza B by PCR NEGATIVE NEGATIVE  Comprehensive metabolic panel     Status: Abnormal   Collection Time: 02/15/22  5:30 AM  Result Value Ref Range   Sodium 137 135 - 145 mmol/L   Potassium 3.7 3.5 - 5.1 mmol/L   Chloride  103 98 - 111 mmol/L   CO2 21 (L) 22 - 32 mmol/L   Glucose, Bld 103 (H) 70 - 99 mg/dL   BUN 17 8 - 23 mg/dL   Creatinine, Ser 7.82 0.61 - 1.24 mg/dL   Calcium 9.3 8.9 - 95.6 mg/dL   Total Protein 7.5 6.5 - 8.1 g/dL   Albumin 4.7 3.5 - 5.0 g/dL   AST 22 15 - 41 U/L   ALT 14 0 - 44 U/L   Alkaline Phosphatase 51 38 - 126 U/L   Total Bilirubin 1.2 0.3 - 1.2 mg/dL  GFR, Estimated >60 >60 mL/min   Anion gap 13 5 - 15   Imaging Studies: No results found.  ED COURSE and MDM  Nursing notes, initial and subsequent vitals signs, including pulse oximetry, reviewed and interpreted by myself.  Vitals:   02/15/22 0337 02/15/22 0340 02/15/22 0430  BP:  (!) 141/94 (!) 151/94  Pulse:  (!) 108 (!) 112  Resp:  20 18  Temp:  98.2 F (36.8 C)   TempSrc:  Oral   SpO2:  99% 99%  Weight: 67 kg    Height: 5\' 10"  (1.778 m)     Medications  LORazepam (ATIVAN) tablet 0.5 mg (0.5 mg Oral Given 02/15/22 0643)  risperiDONE (RISPERDAL) tablet 0.25 mg (has no administration in time range)  sertraline (ZOLOFT) tablet 200 mg (has no administration in time range)  tamsulosin (FLOMAX) capsule 0.4 mg (has no administration in time range)  acetaminophen (TYLENOL) tablet 650 mg (has no administration in time range)    3:58 AM Foley catheter placed by nursing staff without difficulty.  About 3 to 400 mL of clear yellow urine obtained.  Patient feeling better.  5:47 AM The patient's daughter-in-law, Glennon Vangorder, had a long discussion with me about her father-in-law's condition.  She explains that he has had some psychiatric issues for about the past 6 years.  Specifically he has obsessive-compulsive disorder and is focused on his bodily functions.  He believes he must avoid his bowels and bladder on a regular schedule and when he does not void according to the schedule he will frequently believe he has a bowel obstruction or urinary retention.  The symptoms worsened about a year and a half ago after his wife died  unexpectedly and he has been living with his son and daughter-in-law.  In recent weeks his symptoms have become more exaggerated.  They do not believe that he is a danger to himself or others in any active sense but they are concerned he may wander out of the house in a forgetful manner.  They have been considering having him placed in some type of facility that can better care for him as his psychiatric/cognitive conditions progress.  They believe they have reached the limits of what they can manage in their household considering they have two small children to care for as well.  5:53 AM Following discussion with the patient's daughter-in-law his Foley catheter was removed as I do not believe he had true urinary retention but rather a delusion that his urine was being retained.  We will have him assessed by TTS and I think he is a candidate for geriatric psychiatric placement.  PROCEDURES  Procedures CRITICAL CARE Performed by: Carlisle Beers [place] Cl[place] Total critical care time: 30 minutes Critical care time was exclusive of separately billable procedures and treating other patients. Critical care was necessary to treat or prevent imminent or life-threatening deterioration. Critical care was time spent personally by me on the following activities: development of treatment plan with patient and/or surrogate as well as nursing, discussions with consultants, evaluation of patient's response to treatment, examination of patient, obtaining history from patient or surrogate, ordering and performing treatments and interventions, ordering and review of laboratory studies, ordering and review of radiographic studies, pulse oximetry and re-evaluation of patient's condition.   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

## 2022-02-15 NOTE — Progress Notes (Signed)
[facility] LCSW Progress Note ?  ?02/15/2022    11:18 PM ? ?Khyri Hinzman  ? ?[id number]  ? ?Type of Contact and Topic:  Psychiatric Bed Placement  ? ?Pt accepted to [facility]    ? ?Patient meets inpatient criteria per Quintella Reichert, NP ? ?The attending provider will be Cammie Mcgee, NP ? ?Call report to [phone] ? ?Alfredia Ferguson, RN @ [facility] notified.    ? ?Pt scheduled  to arrive at [place] 0900.  ? ? ?Mariea Clonts, MSW, LCSW-A  ?11:19 PM 02/15/2022   ?  ? ?  ?  ? ? ? ? ?  ?

## 2022-02-15 NOTE — Progress Notes (Signed)
CSW spoke with the patient's daughter in-law via phone regarding potential acceptance to [facility]. Due to the patient being voluntary and the family being active involved in his care, they have requested the patient be placed in a facility that is closer. CSW informed the family that she would speak with the NP and clinical team regarding their request and would keep them posted.  ? ? ?Mariea Clonts, MSW, LCSW-A  ?8:39 PM 02/15/2022   ?

## 2022-02-15 NOTE — ED Notes (Signed)
Patient changed out into burgandy scrubs. 1 bag of belongings located across from room 18. ?

## 2022-02-15 NOTE — Progress Notes (Signed)
Patient has been faxed out due to no appropriate beds available. Patient meets [place] inpatient criteria per Quintella Reichert, NP. Patient has been faxed out to the following facilities:  ? ?[facility]  [address] [place] [address] [phone]  ?[place], [place] [address] [phone]  ?[place]  [place], [place] [address] [phone]  ?[facility]  [address] [place] [address] [phone]  ?[facility]  [address] [place], [place] [address] [phone]  ?[facility]  [place], [place] [address] [phone]  ?[facility]  [address] [place] [address] [phone]  ?[facility]  [address] [place] [address] [phone]  ?[place]  [place] [place] [address] [phone]  ?[facility]  [address], [place] [address] [phone]  ?[facility]  [address] [place] [address] [phone]  ?[facility]  [address], [place] [address] [phone]  ?[place], [place] [address] [phone]  ?[facility]  [place], [place] [address] [phone]  ?[facility]  [address] [place] [address] [phone]  ?[place] [address] [place] [address] [phone]  ? ?Mariea Clonts, MSW, LCSW-A  ?9:49 AM 02/15/2022   ?

## 2022-02-15 NOTE — ED Notes (Signed)
Catheter removed by Burnis Medin RN ?

## 2022-02-15 NOTE — ED Notes (Signed)
Bladder scan 274m. ?

## 2022-02-15 NOTE — ED Notes (Signed)
Bed alarm on. Patient in nonskid socks.  ?

## 2022-02-15 NOTE — ED Notes (Signed)
When educating patients family on foley catheter use, patients daughter in law [place] expressed her concerns on when the patient goes home he will be hyper focused on problems with urinating and bowel movements like he has been the past couple weeks. She said his OCD has progressively gotten worse and the family needs guidance and more resources so they can help get patient where he needs to be, whether it is a psychiatric facility or other placement. They said he needs help, his psychiatric problems of depression, anxiety, OCD have exponentially increased and his activities of daily living have been abandoned. Patient has been storing his clothes drenched in urine in the closet because he has been so fixed on it. Patients family and Dr. Florina Ou agree on the decision of letting the patient speak to the counselor (TTS) and go from there. ?

## 2022-02-15 NOTE — ED Notes (Signed)
TTS at bedside. 

## 2022-02-15 NOTE — ED Notes (Signed)
Lab called to process urine drug screen as ordered. ?

## 2022-02-15 NOTE — Discharge Instructions (Addendum)
It was a pleasure caring for you today in the emergency department. ° °Please return to the emergency department for any worsening or worrisome symptoms. ° ° °

## 2022-02-16 DIAGNOSIS — F429 Obsessive-compulsive disorder, unspecified: Secondary | ICD-10-CM | POA: Diagnosis not present

## 2022-02-16 DIAGNOSIS — F22 Delusional disorders: Secondary | ICD-10-CM

## 2022-02-16 MED ORDER — DOCUSATE SODIUM 100 MG PO CAPS
100.0000 mg | ORAL_CAPSULE | Freq: Once | ORAL | Status: AC
  Administered 2022-02-16: 100 mg via ORAL
  Filled 2022-02-16: qty 1

## 2022-02-16 MED ORDER — LORAZEPAM 0.5 MG PO TABS
0.5000 mg | ORAL_TABLET | Freq: Two times a day (BID) | ORAL | Status: DC | PRN
  Administered 2022-02-16: 0.5 mg via ORAL
  Filled 2022-02-16: qty 1

## 2022-02-16 MED ORDER — POLYETHYLENE GLYCOL 3350 17 G PO PACK
17.0000 g | PACK | Freq: Every day | ORAL | Status: DC
  Administered 2022-02-16 – 2022-02-17 (×2): 17 g via ORAL
  Filled 2022-02-16 (×2): qty 1

## 2022-02-16 NOTE — Progress Notes (Signed)
Patient has been declined by [facility] due to no appropriate beds available. Patient meets [place] inpatient criteria per Quintella Reichert, NP. Patient has been faxed out to the following facilities:  ? ? ?[facility]  [address] [place] [address] [phone]  ?[place], [place] [address] [phone]  ?[place]  [place], [place] [address] [phone]  ?[facility]  [address] [place] [address] [phone]  ?[facility]  [address] [place], [place] [address] [phone]  ?[facility]  [place], [place] [address] [phone]  ?[facility]  [address] [place] [address] [phone]  ?[facility]  [address] [place] [address] [phone]  ?[place]  [place]., [place] [address] [phone]  ?[facility]  [address], [place] [address] [phone]  ?[facility]  [address] [place] [address] [phone]  ?[facility]  [address] [place] [address] [phone]  ?[place], [place] [address] [phone]  ?[facility]  [place], [place] [address] [phone]  ?[facility]  [address], [place] [address] [phone]  ?[place] [address], [place] [address] [phone]  ? ?Mariea Clonts, MSW, LCSW-A  ?2:00 PM 02/16/2022   ?

## 2022-02-16 NOTE — ED Provider Notes (Signed)
Emergency Medicine Observation Re-evaluation Note ? ?Kenneth Cunningham is a 76 y.o. male, seen on rounds today.  Pt initially presented to the ED for complaints of Urinary Retention ?Currently, the patient is resting. ? ?Physical Exam  ?BP 129/83 (BP Location: Right Arm)   Pulse 96   Temp (!) 97.4 ?F (36.3 ?C) (Oral)   Resp 16   Ht '5\' 10"'$  (1.778 m)   Wt 67 kg   SpO2 93%   BMI 21.19 kg/m?  ?Physical Exam ?General: NAD ?Cardiac: well perfused ?Lungs: even and unlabored ?Psych: no agitation ? ?ED Course / MDM  ?EKG:EKG Interpretation ? ?Date/Time:  Monday Feb 15 2022 05:42:32 EDT ?Ventricular Rate:  98 ?PR Interval:  154 ?QRS Duration: 87 ?QT Interval:  342 ?QTC Calculation: 437 ?R Axis:   83 ?Text Interpretation: Sinus rhythm Borderline right axis deviation No previous ECGs available Confirmed by Molpus, Jenny Reichmann [phone]) on 02/15/2022 5:56:14 AM ? ?I have reviewed the labs performed to date as well as medications administered while in observation.  Recent changes in the last 24 hours include patient accepted to [facility], accepting provider Cammie Mcgee, NP. EMTALA completed. ? ?Plan  ?Current plan is for inpatient admission, patient accepted to [facility], accepting provider Cammie Mcgee, NP. EMTALA completed. ? Kenneth Cunningham is not under involuntary commitment. ? ? ?  ?Regan Lemming, MD ?02/16/22 [phone] ? ?

## 2022-02-16 NOTE — ED Notes (Signed)
Patient resting quietly with his eyes closed, lights off in room. JRPRN ?

## 2022-02-16 NOTE — Consult Note (Addendum)
?  Provider went to see patient and determine if he needs IVC.  Patient is irritable, angry and fixated on Bowel and bladder.  He is interested in inpatient Psych hospitalization to address his depression but does not want to go far away from home.  His daughter in law [place] is interested in [facility] that have accepted patient.  She also stated she alone cannot make that decision and that she need to contact her husband, patient's son.  They also know DR Fay Records as a personal friend., Psychiatrist and are contacting him for a bed [facility].  If this fails Patient will either be IVC or discharged home to his family.  Patient denied Suicide ideation stating " I must have said something when I came in that is putting me in trouble"  Provider reassured him that he is not in trouble but need to get help. ?

## 2022-02-16 NOTE — Progress Notes (Signed)
Pt Accepted to [facility] 02/17/2022 after 9:00am; Bed Assignment Al[facility] B  ? ?Pt meets inpatient behavioral health placement per Michelle Piper, NP ? ?Phone number for report: [phone] or [phone]  ? ?Care Team notified: Junior Delila Spence, RN, and Celene Kras, [place] ? ? ? ?Benjaman Kindler, MSW, LCSWA ?02/16/2022 7:57 PM ? ?    ?

## 2022-02-17 DIAGNOSIS — F429 Obsessive-compulsive disorder, unspecified: Secondary | ICD-10-CM | POA: Diagnosis not present

## 2022-02-17 NOTE — BH Assessment (Signed)
[facility] Assessment Progress Note ?  ?Pt has reportedly been accepted to [facility].  For details please see note authored by Shelton Silvas LCSW on 02/16/2022 @ 19:55.  At 08:16 this Probation officer called [facility] and spoke to [employer].  She reports that accepting provider is Dr Kathlene Cote.  EDP and pt's nurse will be notified. ? ?Jalene Mullet, MA ?Behavioral Health Coordinator ?[phone]  ?

## 2022-02-17 NOTE — ED Provider Notes (Signed)
Emergency Medicine Observation Re-evaluation Note ? ?Kenneth Cunningham is a 76 y.o. male, seen on rounds today.  Pt initially presented to the ED for complaints of Urinary Retention ?Currently, the patient is resting. ? ?Physical Exam  ?BP (!) 131/96 (BP Location: Left Arm)   Pulse 91   Temp (!) 97.4 ?F (36.3 ?C) (Oral)   Resp 18   Ht '5\' 10"'$  (1.778 m)   Wt 67 kg   SpO2 97%   BMI 21.19 kg/m?  ?Physical Exam ?General: nad ?Cardiac: well perfused ?Lungs: equal chest rise ?Psych: calm ? ?ED Course / MDM  ?EKG:EKG Interpretation ? ?Date/Time:  Monday Feb 15 2022 05:42:32 EDT ?Ventricular Rate:  98 ?PR Interval:  154 ?QRS Duration: 87 ?QT Interval:  342 ?QTC Calculation: 437 ?R Axis:   83 ?Text Interpretation: Sinus rhythm Borderline right axis deviation No previous ECGs available Confirmed by Molpus, Jenny Reichmann [phone]) on 02/15/2022 5:56:14 AM ? ?I have reviewed the labs performed to date as well as medications administered while in observation.  Recent changes in the last 24 hours include [facility] ? ?Plan  ?Current plan is for placement. ? Kenneth Cunningham is not under involuntary commitment. ? ? ?  ?Jeanell Sparrow, DO ?02/17/22 0756 ? ?

## 2022-03-03 ENCOUNTER — Ambulatory Visit (INDEPENDENT_AMBULATORY_CARE_PROVIDER_SITE_OTHER): Payer: Medicare Other | Admitting: Family Medicine

## 2022-03-03 ENCOUNTER — Encounter: Payer: Self-pay | Admitting: Family Medicine

## 2022-03-03 VITALS — BP 128/78 | HR 110 | Ht 70.0 in | Wt 176.4 lb

## 2022-03-03 DIAGNOSIS — F419 Anxiety disorder, unspecified: Secondary | ICD-10-CM

## 2022-03-03 DIAGNOSIS — I10 Essential (primary) hypertension: Secondary | ICD-10-CM | POA: Diagnosis not present

## 2022-03-03 DIAGNOSIS — F429 Obsessive-compulsive disorder, unspecified: Secondary | ICD-10-CM | POA: Diagnosis not present

## 2022-03-03 MED ORDER — RISPERIDONE 3 MG PO TABS: 3.0000 mg | ORAL_TABLET | Freq: Every day | ORAL | 2 refills | Status: DC

## 2022-03-03 NOTE — Progress Notes (Signed)
Provider:  Alain Honey, MD  Careteam: Patient Care Team: Wardell Honour, MD as PCP - General (Family Medicine)  PLACE OF SERVICE:  [facility] Directive information    Allergies  Allergen Reactions   Nsaids Other (See Comments)    Kidney disease per Patient    Chief Complaint  Patient presents with   Medical Management of Chronic Issues    Patient presents today for a 1 month follow-up.   Quality Metric Gaps    Hep C screening, TDAP, zoster, pneumonia      HPI: Patient is a 76 y.o. male patient is here today to follow-up anxiety disorder and obsessive-compulsive disorder.  He has improved greatly since his first visit here a month ago.  That has included 2 trips to the emergency room for what he perceives as urinary retention.  I think the biggest difference was made and a hospitalization at [facility] which is a psychiatric facility in [place].  He was started on risperidone 3 mg and today he is a different person compared to who I saw 1 month ago.  He is accompanied today by daughter-in-law.  He is living with son and daughter-in-law at this time since his wife died.  They are interested in helping him move into assisted living.  He is fully functional but needs help with medication and meals.  I gave him some names of local facilities that they might visit. We spent some time today talking about lab work we did I had his first visit here specifically addressing lipids and kidney function which are slightly abnormal but not enough to treat. He seems to be tolerating medicines without excessive sedation.  Review of Systems:  Review of Systems  Gastrointestinal:  Negative for constipation and vomiting.  Genitourinary:  Negative for dysuria and urgency.  All other systems reviewed and are negative.  Past Medical History:  Diagnosis Date   Anxiety    Per [facility] new patient packet   High blood pressure    Per   High cholesterol    Per [facility] new patient  packet   IBS (irritable bowel syndrome)    Per [facility] new patient packet   OCD (obsessive compulsive disorder)    Per [facility] new patient packet   Prostate cancer ([facility])    Per [facility] new patient packet   Past Surgical History:  Procedure Laterality Date   TONSILECTOMY, ADENOIDECTOMY, BILATERAL MYRINGOTOMY AND TUBES     Per [facility] new patient packet   Social History:   reports that he has never smoked. He has never used smokeless tobacco. He reports that he does not drink alcohol and does not use drugs.  Family History  Problem Relation Age of Onset   Dementia Mother     Medications: Patient's Medications  New Prescriptions   No medications on file  Previous Medications   LORAZEPAM (ATIVAN) 0.5 MG TABLET    Take 0.5 mg by mouth daily as needed for anxiety.   MIRTAZAPINE (REMERON) 15 MG TABLET    Take 15 mg by mouth at bedtime.   ONDANSETRON (ZOFRAN) 4 MG TABLET    Take 4 mg by mouth every 8 (eight) hours as needed for nausea or vomiting.   POLYETHYLENE GLYCOL (MIRALAX / GLYCOLAX) 17 G PACKET    Take 17 g by mouth daily as needed for mild constipation.   RISPERIDONE (RISPERDAL) 3 MG TABLET    Take 3 mg by mouth daily.   SENNOSIDES-DOCUSATE SODIUM (SENOKOT-S) 8.6-50  MG TABLET    Take 1 tablet by mouth daily.   SERTRALINE (ZOLOFT) 100 MG TABLET    Take 200 mg by mouth at bedtime.   TAMSULOSIN (FLOMAX) 0.4 MG CAPS CAPSULE    Take 0.4 mg by mouth every evening.  Modified Medications   No medications on file  Discontinued Medications   RISPERIDONE (RISPERDAL) 0.25 MG TABLET    Take 0.25 mg by mouth at bedtime.    Physical Exam:  Vitals:   03/03/22 1101  BP: 128/78  Pulse: (!) 110  SpO2: 97%  Weight: 176 lb 6.4 oz (80 kg)  Height: '5\' 10"'$  (1.778 m)   Body mass index is 25.31 kg/m. Wt Readings from Last 3 Encounters:  03/03/22 176 lb 6.4 oz (80 kg)  02/15/22 147 lb 11.3 oz (67 kg)  02/03/22 147 lb 9.6 oz (67 kg)    Physical Exam Vitals and nursing note reviewed.  Constitutional:       Appearance: Normal appearance.  Cardiovascular:     Rate and Rhythm: Normal rate.  Abdominal:     General: Abdomen is flat. Bowel sounds are normal.     Palpations: Abdomen is soft.  Neurological:     General: No focal deficit present.     Mental Status: He is alert and oriented to person, place, and time.  Psychiatric:        Mood and Affect: Mood normal.        Thought Content: Thought content normal.        Judgment: Judgment normal.    Labs reviewed: Basic Metabolic Panel: Recent Labs    02/03/22 1009 02/09/22 2236 02/15/22 0530  NA 141 136 137  K 3.9 4.1 3.7  CL 104 101 103  CO2 26 25 21*  GLUCOSE 95 98 103*  BUN 21 24* 17  CREATININE 1.35* 1.50* 1.14  CALCIUM 9.3 9.3 9.3   Liver Function Tests: Recent Labs    02/03/22 1009 02/09/22 2236 02/15/22 0530  AST '14 23 22  '$ ALT 8* 13 14  ALKPHOS  --  51 51  BILITOT 0.5 1.0 1.2  PROT 6.6 7.7 7.5  ALBUMIN  --  4.6 4.7   No results for input(s): LIPASE, AMYLASE in the last 8760 hours. No results for input(s): AMMONIA in the last 8760 hours. CBC: Recent Labs    02/09/22 2236 02/15/22 0526  WBC 10.8* 7.2  NEUTROABS  --  5.7  HGB 14.5 14.5  HCT 44.1 43.7  MCV 89.1 88.5  PLT 194 156   Lipid Panel: Recent Labs    02/03/22 1009  CHOL 211*  HDL 56  LDLCALC 134*  TRIG 99  CHOLHDL 3.8   TSH: No results for input(s): TSH in the last 8760 hours. A1C: No results found for: HGBA1C   Assessment/Plan  1. Anxiety Improved.  Continue on lorazepam 3 times daily  2. Hypertension, unspecified type Blood pressure is normal today at 128/78.  Continue on metoprolol  3. Obsessive-compulsive disorder, unspecified type Has received psychiatric treatment both from local psychiatry as well as at facility in [place].  I think combination of this time of sertraline risperidone lorazepam and mirtazapine is working and advised him to think in terms of chronic control of symptoms and problem rather than  short-term   Alain Honey, MD [facility] [phone]

## 2022-03-03 NOTE — Addendum Note (Signed)
Addended by: Wardell Honour on: 03/03/2022 02:32 PM   Modules accepted: Orders

## 2022-03-09 LAB — HM COLONOSCOPY

## 2022-04-19 ENCOUNTER — Telehealth: Payer: Self-pay | Admitting: *Deleted

## 2022-04-19 NOTE — Telephone Encounter (Signed)
Paperwork Completed and Faxed to [employer] with [employer]

## 2022-04-19 NOTE — Telephone Encounter (Signed)
Received fax from [facility] with [facility] [phone] requesting Confidential Medical Profile to be completed for admission.   Placed paperwork in Dr. Ammie Ferrier folder to review and sign. Attached OV note and Medication/Allergy list.   To be faxed back to [facility] Fax: [phone]

## 2022-06-07 ENCOUNTER — Other Ambulatory Visit: Payer: Self-pay | Admitting: Family Medicine

## 2022-06-07 DIAGNOSIS — F429 Obsessive-compulsive disorder, unspecified: Secondary | ICD-10-CM

## 2022-06-07 NOTE — Telephone Encounter (Signed)
This encounter was created in error - please disregard.

## 2022-06-07 NOTE — Telephone Encounter (Signed)
Rx is pended for a six months supply. Routing to Dr.Miller to advise when patient is to have a follow up appointment.

## 2022-07-12 ENCOUNTER — Encounter: Payer: Medicare Other | Admitting: Adult Health

## 2022-07-21 ENCOUNTER — Encounter: Payer: Self-pay | Admitting: Internal Medicine

## 2022-07-21 ENCOUNTER — Non-Acute Institutional Stay: Payer: Medicare Other | Admitting: Internal Medicine

## 2022-07-21 VITALS — BP 138/86 | HR 86 | Temp 97.5°F | Ht 70.0 in | Wt 187.0 lb

## 2022-07-21 DIAGNOSIS — R000 Tachycardia, unspecified: Secondary | ICD-10-CM

## 2022-07-21 DIAGNOSIS — K581 Irritable bowel syndrome with constipation: Secondary | ICD-10-CM

## 2022-07-21 DIAGNOSIS — F419 Anxiety disorder, unspecified: Secondary | ICD-10-CM

## 2022-07-21 DIAGNOSIS — C61 Malignant neoplasm of prostate: Secondary | ICD-10-CM

## 2022-07-21 DIAGNOSIS — F429 Obsessive-compulsive disorder, unspecified: Secondary | ICD-10-CM | POA: Diagnosis not present

## 2022-07-21 DIAGNOSIS — K4090 Unilateral inguinal hernia, without obstruction or gangrene, not specified as recurrent: Secondary | ICD-10-CM

## 2022-07-21 DIAGNOSIS — E7849 Other hyperlipidemia: Secondary | ICD-10-CM

## 2022-07-21 DIAGNOSIS — F32A Depression, unspecified: Secondary | ICD-10-CM

## 2022-07-21 NOTE — Progress Notes (Signed)
Location:  [facility] of Service:  Clinic (12)  Provider:   Code Status:  Goals of Care:     07/21/2022    8:04 AM  Advanced Directives  Does Patient Have a Medical Advance Directive? Yes  Type of Paramedic of [place];Living will  Does patient want to make changes to medical advance directive? No - Patient declined     Chief Complaint  Patient presents with   Medical Management of Chronic Issues    Medical Management of Chronic Issues.    Quality Metric Gaps    To Discuss need for Covid, Hep C screening, Zoster, Colonoscopy, Pneumonia, Tdap or postpone if patient refuses. NCIR Verified.     HPI: Patient is a 76 y.o. male seen today for medical management of chronic diseases.    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 [employer] with no assist   Past Medical History:  Diagnosis Date   Anxiety    Per [place] new patient packet   High blood pressure    Per   High cholesterol    Per [facility] new patient packet   IBS (irritable bowel syndrome)    Per [facility] new patient packet   OCD (obsessive compulsive disorder)    Per [place] new patient packet   Prostate cancer ([place])    Per [place] new patient packet    Past Surgical History:  Procedure Laterality Date   TONSILECTOMY, ADENOIDECTOMY, BILATERAL MYRINGOTOMY AND TUBES     Per [place] new patient packet    Allergies  Allergen Reactions   Nsaids Other (See Comments)    Kidney disease per Patient    Outpatient Encounter Medications as of 07/21/2022  Medication Sig    LORazepam (ATIVAN) 0.5 MG tablet Take 0.5 mg by mouth 2 (two) times daily as needed for anxiety.   mirtazapine (REMERON) 15 MG tablet Take 15 mg by mouth at bedtime.   polyethylene glycol (MIRALAX / GLYCOLAX) 17 g packet Take 17 g by mouth daily.   Probiotic Product (PROBIOTIC GUMMIES PO) Take 2 each by mouth daily.   risperiDONE (RISPERDAL) 3 MG tablet Take 1 tablet by mouth once daily   sennosides-docusate sodium (SENOKOT-S) 8.6-50 MG tablet Take 1 tablet by mouth daily as needed for constipation.   sertraline (ZOLOFT) 100 MG tablet Take 200 mg by mouth at bedtime.   tamsulosin (FLOMAX) 0.4 MG CAPS capsule Take 0.8 mg by mouth every evening.   [DISCONTINUED] ondansetron (ZOFRAN) 4 MG tablet Take 4 mg by mouth every 8 (eight) hours as needed for nausea or vomiting.   No facility-administered encounter medications on file as of 07/21/2022.    Review of Systems:  Review of Systems  Constitutional:  Negative for activity change, appetite change and unexpected weight change.  HENT: Negative.    Respiratory:  Negative for cough and shortness of breath.   Cardiovascular:  Negative for leg swelling.  Gastrointestinal:  Positive for constipation.  Genitourinary:  Negative for frequency.  Musculoskeletal:  Negative for  arthralgias, gait problem and myalgias.  Skin: Negative.  Negative for rash.  Neurological:  Negative for dizziness and weakness.  Psychiatric/Behavioral:  Negative for confusion and sleep disturbance.   All other systems reviewed and are negative.   Health Maintenance  Topic Date Due   COVID-19 Vaccine (1) Never done   Hepatitis C Screening  Never done   Zoster Vaccines- Shingrix (1 of 2) Never done   COLONOSCOPY (Pts 45-24yr Insurance coverage will need to be confirmed)  Never done   TETANUS/TDAP  12/05/2018   INFLUENZA VACCINE  Completed   HPV VACCINES  Aged Out   Pneumonia Vaccine 76 Years old  Discontinued    Physical Exam: Vitals:   07/21/22 0805  BP: 138/86   Pulse: 86  Temp: (!) 97.5 F (36.4 C)  SpO2: 97%  Weight: 187 lb (84.8 kg)  Height: '5\' 10"'$  (1.778 m)   Body mass index is 26.83 kg/m. Physical Exam Vitals reviewed.  Constitutional:      Appearance: Normal appearance.  HENT:     Head: Normocephalic.     Right Ear: Tympanic membrane normal.     Left Ear: Tympanic membrane normal.     Nose: Nose normal.     Mouth/Throat:     Mouth: Mucous membranes are moist.     Pharynx: Oropharynx is clear.  Eyes:     Pupils: Pupils are equal, round, and reactive to light.  Cardiovascular:     Rate and Rhythm: Regular rhythm. Tachycardia present.     Pulses: Normal pulses.     Heart sounds: No murmur heard. Pulmonary:     Effort: Pulmonary effort is normal. No respiratory distress.     Breath sounds: Normal breath sounds. No rales.  Abdominal:     General: Abdomen is flat. Bowel sounds are normal.     Palpations: Abdomen is soft.  Musculoskeletal:        General: No swelling.     Cervical back: Neck supple.  Skin:    General: Skin is warm.  Neurological:     General: No focal deficit present.     Mental Status: He is alert and oriented to person, place, and time.  Psychiatric:        Mood and Affect: Mood normal.        Thought Content: Thought content normal.     Comments: Flat effect     Labs reviewed: Basic Metabolic Panel: Recent Labs    02/03/22 1009 02/09/22 2236 02/15/22 0530  NA 141 136 137  K 3.9 4.1 3.7  CL 104 101 103  CO2 26 25 21*  GLUCOSE 95 98 103*  BUN 21 24* 17  CREATININE 1.35* 1.50* 1.14  CALCIUM 9.3 9.3 9.3   Liver Function Tests: Recent Labs    02/03/22 1009 02/09/22 2236 02/15/22 0530  AST '14 23 22  '$ ALT 8* 13 14  ALKPHOS  --  51 51  BILITOT 0.5 1.0 1.2  PROT 6.6 7.7 7.5  ALBUMIN  --  4.6 4.7   No results for input(s): "LIPASE", "AMYLASE" in the last 8760 hours. No results for input(s): "AMMONIA" in the last 8760 hours. CBC: Recent Labs    02/09/22 2236 02/15/22 0526  WBC 10.8*  7.2  NEUTROABS  --  5.7  HGB 14.5 14.5  HCT 44.1 43.7  MCV 89.1 88.5  PLT 194 156   Lipid Panel: Recent Labs    02/03/22 1009  CHOL 211*  HDL 56  LDLCALC 134*  TRIG  99  CHOLHDL 3.8   No results found for: "HGBA1C"  Procedures since last visit: No results found.  Assessment/Plan 1. Obsessive-compulsive disorder, unspecified type Under Dr Reece Levy Care On Risperdal and Ativan  2. Anxiety and depression On Remeron and Zoloft  3. Prostate cancer [facility]) Follows with Urology  4. Hernia, inguinal, left Has seen Surgeon Is planning to get it repaired at some time  5. Irritable bowel syndrome with constipation Will try to get Colonoascopy report From [facility]  6. Other hyperlipidemia Repeat Lipid panel  Possible discuss Statins next visit 7 Tachycardia Most likely due to anxiety Will do EKG  Written order for TDAP and Shingrix   Labs/tests ordered:  [facility] Lipid panel before next visit Next appt:  11/22/2022

## 2022-11-08 LAB — CBC AND DIFFERENTIAL
HCT: 34 — AB (ref 41–53)
Hemoglobin: 11.2 — AB (ref 13.5–17.5)
Platelets: 183 10*3/uL (ref 150–400)
WBC: 6.6

## 2022-11-08 LAB — BASIC METABOLIC PANEL
BUN: 17 (ref 4–21)
CO2: 23 — AB (ref 13–22)
Chloride: 100 (ref 99–108)
Creatinine: 1.2 (ref 0.6–1.3)
Glucose: 138
Potassium: 4.5 mEq/L (ref 3.5–5.1)
Sodium: 138 (ref 137–147)

## 2022-11-08 LAB — COMPREHENSIVE METABOLIC PANEL
Calcium: 9.1 (ref 8.7–10.7)
eGFR: 66

## 2022-11-08 LAB — CBC: RBC: 4.46 (ref 3.87–5.11)

## 2022-11-09 ENCOUNTER — Encounter: Payer: Self-pay | Admitting: Orthopedic Surgery

## 2022-11-09 ENCOUNTER — Non-Acute Institutional Stay: Payer: Medicare Other | Admitting: Orthopedic Surgery

## 2022-11-09 DIAGNOSIS — K581 Irritable bowel syndrome with constipation: Secondary | ICD-10-CM | POA: Diagnosis not present

## 2022-11-09 DIAGNOSIS — K4021 Bilateral inguinal hernia, without obstruction or gangrene, recurrent: Secondary | ICD-10-CM | POA: Diagnosis not present

## 2022-11-09 NOTE — Progress Notes (Signed)
Location:    Nursing Home Room Number: 610/A Place of Service:  ALF [phone]) Provider:  Yvonna Alanis, NP   Virgie Dad, MD  Patient Care Team: Virgie Dad, MD as PCP - General (Internal Medicine)  Extended Emergency Contact Information Primary Emergency Contact: Soileau,Virginia Mobile Phone: [phone] Relation: Daughter Interpreter needed? No Secondary Emergency Contact: Lohr,David Mobile Phone: [phone] Relation: Son Interpreter needed? No  Code Status:  Full code Goals of care: Advanced Directive information    07/21/2022    8:04 AM  Advanced Directives  Does Patient Have a Medical Advance Directive? Yes  Type of Paramedic of [place];Living will  Does patient want to make changes to medical advance directive? No - Patient declined     Chief Complaint  Patient presents with   Acute Visit    HPI:  Pt is a 77 y.o. male seen today for acute visit due to abdominal pain.   He currently resides on the assisted living unit at [facility] PMH: HTN, IBS, prostate cancer, anxiety and OCD.   Increased abdominal pain x 2 days. Increased pain to RLQ and LLQ, described as sharp/tight. Denies fever, nausea, vomiting. 01/29 KUB revealed moderate fecal material in colon comparable with constipation. He is on miralax daily but does not always take it. Senna prn, no recent doses administered. Admits he does not drink water well. He also reports protruding inguinal hernia to left groin 01/29. Resolved today.  Denies pain to hernia. Evaluated by [facility] 03/2022, diagnosed with bilateral inguinal hernia without obstruction. Appears he discussed laparoscopic/robotic bilateral hernias repair with mesh. Does not appear it was done.   01/29 labs: WBC 6.6, hgb 11.2, hct 34.4, platelets 183, glucose 138, BUN/creat 16.6/1.16, sodium 138, potassium 4.5.    Past Medical History:  Diagnosis Date   Anxiety    Per [place] new patient packet   High blood pressure     Per   High cholesterol    Per [facility] new patient packet   IBS (irritable bowel syndrome)    Per [facility] new patient packet   OCD (obsessive compulsive disorder)    Per [facility] new patient packet   Prostate cancer ([place])    Per [place] new patient packet   Past Surgical History:  Procedure Laterality Date   TONSILECTOMY, ADENOIDECTOMY, BILATERAL MYRINGOTOMY AND TUBES     Per [place] new patient packet    Allergies  Allergen Reactions   Nsaids Other (See Comments)    Kidney disease per Patient    Outpatient Encounter Medications as of 11/09/2022  Medication Sig   LORazepam (ATIVAN) 0.5 MG tablet Take 0.5 mg by mouth 2 (two) times daily as needed for anxiety.   mirtazapine (REMERON) 15 MG tablet Take 15 mg by mouth at bedtime.   polyethylene glycol (MIRALAX / GLYCOLAX) 17 g packet Take 17 g by mouth daily.   Probiotic Product (PROBIOTIC GUMMIES PO) Take 2 each by mouth daily.   risperiDONE (RISPERDAL) 3 MG tablet Take 1 tablet by mouth once daily   sennosides-docusate sodium (SENOKOT-S) 8.6-50 MG tablet Take 1 tablet by mouth daily as needed for constipation.   sertraline (ZOLOFT) 100 MG tablet Take 200 mg by mouth at bedtime.   tamsulosin (FLOMAX) 0.4 MG CAPS capsule Take 0.8 mg by mouth every evening.   No facility-administered encounter medications on file as of 11/09/2022.    Review of Systems  Constitutional:  Negative for activity change and appetite change.  Respiratory:  Negative for cough, shortness  of breath and wheezing.   Cardiovascular:  Negative for chest pain and leg swelling.  Gastrointestinal:  Positive for abdominal distention, abdominal pain, constipation and diarrhea. Negative for blood in stool, nausea and vomiting.  Psychiatric/Behavioral:  Positive for dysphoric mood. Negative for confusion and sleep disturbance. The patient is nervous/anxious.     Immunization History  Administered Date(s) Administered   Influenza, High Dose Seasonal PF 07/16/2022   Influenza-Unspecified  07/12/2017, 07/26/2018, 09/18/2019   Pneumococcal Conjugate PCV 7 08/06/2015   Pneumococcal Polysaccharide-23 08/13/2013   Td 12/05/2008   Pertinent  Health Maintenance Due  Topic Date Due   COLONOSCOPY (Pts 45-60yr Insurance coverage will need to be confirmed)  Never done   INFLUENZA VACCINE  Completed      02/15/2022    9:35 PM 02/16/2022    9:27 AM 02/17/2022    2:02 AM 03/03/2022   11:14 AM 07/21/2022    8:05 AM  Fall Risk  Falls in the past year?    0   Was there an injury with Fall?    0 0  Fall Risk Category Calculator    0   Fall Risk Category (Retired)    Low   (RETIRED) Patient Fall Risk Level Low fall risk Moderate fall risk Low fall risk Low fall risk   Patient at Risk for Falls Due to    No Fall Risks   Fall risk Follow up    Falls evaluation completed    Functional Status Survey:    Vitals:   11/09/22 1320  BP: 123/78  Pulse: 92  Resp: 18  Temp: 98.6 F (37 C)  SpO2: 96%  Weight: 191 lb 3.2 oz (86.7 kg)  Height: '5\' 10"'$  (1.778 m)   Body mass index is 27.43 kg/m. Physical Exam Vitals reviewed.  Constitutional:      General: He is not in acute distress. HENT:     Head: Normocephalic.  Eyes:     General:        Right eye: No discharge.        Left eye: No discharge.  Cardiovascular:     Rate and Rhythm: Normal rate and regular rhythm.     Pulses: Normal pulses.     Heart sounds: Normal heart sounds.  Pulmonary:     Effort: Pulmonary effort is normal. No respiratory distress.     Breath sounds: Normal breath sounds. No wheezing.  Abdominal:     General: Bowel sounds are normal. There is distension.     Palpations: Abdomen is soft. There is no mass.     Tenderness: There is no abdominal tenderness. There is no guarding or rebound.     Hernia: A hernia is present.     Comments: Mild abdominal distension. BS+ x 4. No hernias present on exam.   Musculoskeletal:     Cervical back: Neck supple.  Skin:    General: Skin is warm and dry.     Capillary  Refill: Capillary refill takes less than 2 seconds.  Neurological:     General: No focal deficit present.     Mental Status: He is alert and oriented to person, place, and time.  Psychiatric:        Mood and Affect: Mood normal.        Behavior: Behavior normal.     Labs reviewed: Recent Labs    02/03/22 1009 02/09/22 2236 02/15/22 0530 11/08/22 2013  NA 141 136 137 138  K 3.9 4.1 3.7 4.5  CL  104 101 103 100  CO2 26 25 21* 23*  GLUCOSE 95 98 103*  --   BUN 21 24* 17 17  CREATININE 1.35* 1.50* 1.14 1.2  CALCIUM 9.3 9.3 9.3 9.1   Recent Labs    02/03/22 1009 02/09/22 2236 02/15/22 0530  AST '14 23 22  '$ ALT 8* 13 14  ALKPHOS  --  51 51  BILITOT 0.5 1.0 1.2  PROT 6.6 7.7 7.5  ALBUMIN  --  4.6 4.7   Recent Labs    02/09/22 2236 02/15/22 0526 11/08/22 2013  WBC 10.8* 7.2 6.6  NEUTROABS  --  5.7  --   HGB 14.5 14.5 11.2*  HCT 44.1 43.7 34*  MCV 89.1 88.5  --   PLT 194 156 183   No results found for: "TSH" No results found for: "HGBA1C" Lab Results  Component Value Date   CHOL 211 (H) 02/03/2022   HDL 56 02/03/2022   LDLCALC 134 (H) 02/03/2022   TRIG 99 02/03/2022   CHOLHDL 3.8 02/03/2022    Significant Diagnostic Results in last 30 days:  No results found.  Assessment/Plan 1. Irritable bowel syndrome with constipation - 01/29 KUB reveals moderate fecal material in colon - abdomen mildly distended, non tender, normal BS x 4 - no nausea/vomiting - 01/29 labs unremarkable - fleet enema - Miralax BID x 1 day, then daily - start senna po qhs - encourage hydration with water  2. Bilateral recurrent inguinal hernia without obstruction or gangrene - 01/29 protruding hernia to left groin - 03/2022 evaluated by [facility]> recommended laparoscopic/robotic bilateral inguinal hernia repair with mesh> no completed - denies pain when protruding and not     Family/ staff Communication: plan discussed with patient and nurse  Labs/tests ordered:  none

## 2022-11-20 DIAGNOSIS — K5900 Constipation, unspecified: Secondary | ICD-10-CM | POA: Insufficient documentation

## 2022-11-20 NOTE — Progress Notes (Unsigned)
Location:  [facility]  POS: Clinic  Provider: Royal Hawthorn, ANP  Code Status: Full code  Goals of Care:     11/22/2022    1:05 PM  Advanced Directives  Does Patient Have a Medical Advance Directive? No  Would patient like information on creating a medical advance directive? No - Patient declined     Chief Complaint  Patient presents with   Medical Management of Chronic Issues    4 month follow up. Complains of Constipation.    Immunizations    Covid 19, shingles, Tdap   Quality Metric Gaps    Discussed the need to AWV,hep C screening    HPI: Patient is a 77 y.o. male seen today for medical management of chronic diseases.    Reports that he has not been walking well, stumbling and feels tired. Not sure how long.   Moved to [place] in 2023 due to having worsening anxiety and OCD. Established with Dr Reece Levy in [place] for med management and behaviors improved.   PMH bilateral inguinal hernia, prostate ca with radiation therapy followed by urology, IBS followed by GI, HLD, HTN  MMSE 04/2022 30/30 passed clock  Ambulatory without device.   Issues with constipation last month improved now on miralax and senokot but still feels like its an issue.   Reports his inguinal on left which hurts. Had seen surgery and they recommended surgery but he had held off for now.   Uses ativan prn for anxiety, nursing charting effective in matrix.   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  Past Medical History:  Diagnosis Date   Anxiety    Per [place] new patient packet   High blood pressure    Per   High cholesterol    Per [facility] new patient packet   IBS (irritable bowel syndrome)    Per [facility] new patient packet   OCD (obsessive compulsive disorder)    Per [facility] new patient packet   Prostate cancer ([place])    Per [place] new patient packet    Past Surgical History:  Procedure Laterality Date   TONSILECTOMY, ADENOIDECTOMY, BILATERAL MYRINGOTOMY AND  TUBES     Per [place] new patient packet    Allergies  Allergen Reactions   Nsaids Other (See Comments)    Kidney disease per Patient    Outpatient Encounter Medications as of 11/22/2022  Medication Sig   LORazepam (ATIVAN) 0.5 MG tablet Take 0.5 mg by mouth 3 (three) times daily as needed for anxiety.   mirtazapine (REMERON) 15 MG tablet Take 15 mg by mouth at bedtime.   polyethylene glycol (MIRALAX / GLYCOLAX) 17 g packet Take 17 g by mouth daily.   Probiotic Product (PROBIOTIC GUMMIES PO) Take 2 each by mouth daily.   risperiDONE (RISPERDAL) 3 MG tablet Take 1 tablet by mouth once daily   sennosides-docusate sodium (SENOKOT-S) 8.6-50 MG tablet Take 1 tablet by mouth at bedtime.   sertraline (ZOLOFT) 100 MG tablet Take 200 mg by mouth at bedtime.   tamsulosin (FLOMAX) 0.4 MG CAPS capsule Take 0.8 mg by mouth every evening.   No facility-administered encounter medications on file as of 11/22/2022.    Review of Systems:  Review of Systems  Constitutional:  Positive for fatigue. Negative for activity change, appetite change, chills, diaphoresis, fever and unexpected weight change.  Respiratory:  Negative for cough, shortness of breath, wheezing and stridor.   Cardiovascular:  Negative for chest pain, palpitations and leg swelling.  Gastrointestinal:  Positive for constipation. Negative for abdominal distention, abdominal pain and diarrhea.  Genitourinary:  Negative for difficulty urinating and dysuria.  Musculoskeletal:  Positive for gait problem. Negative for arthralgias, back pain, joint swelling and myalgias.  Neurological:  Negative for dizziness, seizures, syncope, facial asymmetry, speech difficulty, weakness and headaches.  Hematological:  Negative for adenopathy. Does not bruise/bleed easily.  Psychiatric/Behavioral:  Negative for agitation, behavioral problems and confusion. The patient is nervous/anxious.     Health Maintenance  Topic Date Due   Medicare Annual Wellness  (AWV)  Never done   Hepatitis C Screening  Never done   Zoster Vaccines- Shingrix (1 of 2) Never done   DTaP/Tdap/Td (3 - Tdap) 12/05/2018   COVID-19 Vaccine (2 - Moderna risk series) 09/13/2022   COLONOSCOPY (Pts 45-42yr Insurance coverage will need to be confirmed)  03/09/2032   INFLUENZA VACCINE  Completed   HPV VACCINES  Aged Out   Pneumonia Vaccine 77 Years old  Discontinued    Physical Exam: Vitals:   11/22/22 1308  BP: 128/84  Pulse: 94  Temp: 98.3 F (36.8 C)  SpO2: 100%  Weight: 197 lb 9.6 oz (89.6 kg)  Height: 5' 10"$  (1.778 m)   Body mass index is 28.35 kg/m. Physical Exam Vitals and nursing note reviewed.  Constitutional:      General: He is not in acute distress.    Appearance: He is not diaphoretic.  HENT:     Head: Normocephalic and atraumatic.  Neck:     Thyroid: No thyromegaly.     Vascular: No JVD.     Trachea: No tracheal deviation.  Cardiovascular:     Rate and Rhythm: Normal rate and regular rhythm.     Heart sounds: No murmur heard. Pulmonary:     Effort: Pulmonary effort is normal. No respiratory distress.     Breath sounds: Normal breath sounds. No wheezing.  Abdominal:     General: Bowel sounds are normal. There is no distension.     Palpations: Abdomen is soft.     Tenderness: There is no abdominal tenderness.  Musculoskeletal:     Comments: Slow gait, slight shuffle.   Lymphadenopathy:     Cervical: No cervical adenopathy.  Skin:    General: Skin is warm and dry.  Neurological:     Mental Status: He is alert and oriented to person, place, and time.     Cranial Nerves: No cranial nerve deficit.  Psychiatric:     Comments: Flat appears depressed      Labs reviewed: Basic Metabolic Panel: Recent Labs    02/03/22 1009 02/09/22 2236 02/15/22 0530 11/08/22 2013  NA 141 136 137 138  K 3.9 4.1 3.7 4.5  CL 104 101 103 100  CO2 26 25 21* 23*  GLUCOSE 95 98 103*  --   BUN 21 24* 17 17  CREATININE 1.35* 1.50* 1.14 1.2  CALCIUM  9.3 9.3 9.3 9.1   Liver Function Tests: Recent Labs    02/03/22 1009 02/09/22 2236 02/15/22 0530  AST 14 23 22  $ ALT 8* 13 14  ALKPHOS  --  51 51  BILITOT 0.5 1.0 1.2  PROT 6.6 7.7 7.5  ALBUMIN  --  4.6 4.7   No results for input(s): "LIPASE", "AMYLASE" in the last 8760 hours. No results for input(s): "AMMONIA" in the last 8760 hours. CBC: Recent Labs    02/09/22 2236 02/15/22 0526 11/08/22 2013  WBC 10.8* 7.2 6.6  NEUTROABS  --  5.7  --  HGB 14.5 14.5 11.2*  HCT 44.1 43.7 34*  MCV 89.1 88.5  --   PLT 194 156 183   Lipid Panel: Recent Labs    02/03/22 1009  CHOL 211*  HDL 56  LDLCALC 134*  TRIG 99  CHOLHDL 3.8   No results found for: "HGBA1C"  Procedures since last visit: No results found.  Assessment/Plan  1. Slow transit constipation Improved Good bowel sounds Conitnue miralax and senokot s Can ask nurse for prn supp if needed.   2. Microcytic anemia Needs repeat labs Hemoccult x 3   3. Bilateral recurrent inguinal hernia without obstruction or gangrene No signs of acute issue, can f/u with surgery if he wants to pursue.   4. Anxiety Using ativan prn Went over how  5. Obsessive-compulsive disorder, unspecified type On risperdal, zoloft, ativan We discussed that some of the meds he is on can affect his gait, however, they are providing a QOL for him so he wants to continue them.  Followed by Dr Reece Levy   6. Other fatigue ?if this is related to medications or anemia Will recheck CBC Iron panel TSH   7. Memory loss Reports memory loss  Needs repeat MMSE Need immunization record.   Labs/tests ordered:  * No order type specified * CBC Iron panel TSH in 2 weeks   Next appt:  1 month with Cindi Carbon NP   Total time 71mn:  time greater than 50% of total time spent doing pt counseling and coordination of care

## 2022-11-22 ENCOUNTER — Encounter: Payer: Self-pay | Admitting: Adult Health

## 2022-11-22 ENCOUNTER — Non-Acute Institutional Stay: Payer: Medicare Other | Admitting: Adult Health

## 2022-11-22 VITALS — BP 128/84 | HR 94 | Temp 98.3°F | Ht 70.0 in | Wt 197.6 lb

## 2022-11-22 DIAGNOSIS — R5383 Other fatigue: Secondary | ICD-10-CM

## 2022-11-22 DIAGNOSIS — R413 Other amnesia: Secondary | ICD-10-CM

## 2022-11-22 DIAGNOSIS — D509 Iron deficiency anemia, unspecified: Secondary | ICD-10-CM | POA: Diagnosis not present

## 2022-11-22 DIAGNOSIS — F429 Obsessive-compulsive disorder, unspecified: Secondary | ICD-10-CM

## 2022-11-22 DIAGNOSIS — K5901 Slow transit constipation: Secondary | ICD-10-CM

## 2022-11-22 DIAGNOSIS — F419 Anxiety disorder, unspecified: Secondary | ICD-10-CM | POA: Diagnosis not present

## 2022-11-22 DIAGNOSIS — K4021 Bilateral inguinal hernia, without obstruction or gangrene, recurrent: Secondary | ICD-10-CM | POA: Diagnosis not present

## 2022-11-23 LAB — HEPATIC FUNCTION PANEL
ALT: 8 U/L — AB (ref 10–40)
AST: 19 (ref 14–40)
Alkaline Phosphatase: 71 (ref 25–125)

## 2022-11-23 LAB — CBC AND DIFFERENTIAL
HCT: 35 — AB (ref 41–53)
Hemoglobin: 11.1 — AB (ref 13.5–17.5)
Platelets: 187 10*3/uL (ref 150–400)
WBC: 5

## 2022-11-23 LAB — BASIC METABOLIC PANEL
BUN: 19 (ref 4–21)
CO2: 26 — AB (ref 13–22)
Chloride: 96 — AB (ref 99–108)
Creatinine: 1.2 (ref 0.6–1.3)
Glucose: 88
Potassium: 4.3 mEq/L (ref 3.5–5.1)
Sodium: 136 — AB (ref 137–147)

## 2022-11-23 LAB — LIPID PANEL
Cholesterol: 268 — AB (ref 0–200)
HDL: 51 (ref 35–70)
LDL Cholesterol: 192
Triglycerides: 127 (ref 40–160)

## 2022-11-23 LAB — COMPREHENSIVE METABOLIC PANEL
Albumin: 4.4 (ref 3.5–5.0)
Calcium: 8.8 (ref 8.7–10.7)

## 2022-11-23 LAB — CBC: RBC: 4.47 (ref 3.87–5.11)

## 2022-12-06 LAB — CBC AND DIFFERENTIAL
HCT: 31 — AB (ref 41–53)
Hemoglobin: 10 — AB (ref 13.5–17.5)
Platelets: 163 10*3/uL (ref 150–400)
WBC: 4.8

## 2022-12-06 LAB — CBC: RBC: 4.03 (ref 3.87–5.11)

## 2022-12-06 LAB — IRON,TIBC AND FERRITIN PANEL: Iron: 19

## 2022-12-06 LAB — TSH: TSH: 1.6 (ref 0.41–5.90)

## 2022-12-07 ENCOUNTER — Encounter: Payer: Self-pay | Admitting: Orthopedic Surgery

## 2022-12-07 ENCOUNTER — Non-Acute Institutional Stay: Payer: Medicare Other | Admitting: Orthopedic Surgery

## 2022-12-07 DIAGNOSIS — D649 Anemia, unspecified: Secondary | ICD-10-CM | POA: Diagnosis not present

## 2022-12-07 DIAGNOSIS — R5383 Other fatigue: Secondary | ICD-10-CM

## 2022-12-07 DIAGNOSIS — K5901 Slow transit constipation: Secondary | ICD-10-CM | POA: Diagnosis not present

## 2022-12-07 DIAGNOSIS — D509 Iron deficiency anemia, unspecified: Secondary | ICD-10-CM | POA: Diagnosis not present

## 2022-12-07 NOTE — Progress Notes (Signed)
Location:   [facility] Room Number: [id number] Place of Service:  ALF [phone]) Provider: Windell Moulding, NP   PCP: Virgie Dad, MD  Patient Care Team: Virgie Dad, MD as PCP - General (Internal Medicine)  Extended Emergency Contact Information Primary Emergency Contact: Siler,Virginia Mobile Phone: [phone] Relation: Daughter Interpreter needed? No Secondary Emergency Contact: Hafford,David Mobile Phone: [phone] Relation: Son Interpreter needed? No  Code Status:  FULL CODE Goals of care: Advanced Directive information    12/07/2022    9:58 AM  Advanced Directives  Does Patient Have a Medical Advance Directive? No  Would patient like information on creating a medical advance directive? No - Patient declined     Chief Complaint  Patient presents with   Acute Visit    Low Hemoglobin    HPI:  Pt is a 77 y.o. male seen today for an acute visit due to low hemoglobin.   He currently resides on the assisted living unit at [facility] PMH: HTN, IBS, prostate cancer, anxiety and OCD.   Recent workup for constipation/diarrhea. Hgb noted to be lower. Recent hemoccults negative for blood. He reports increased fatigue x 2 months.  Hgb 10.0 (02/26)> was 11.1 (02/13)> 11.2 (01/29)> 14.5 (02/2022). Hct 31.1> was 34.5 (02/13). Platelets 163. Iron 19. TSH 1.60.   Had colonoscopy last year, tubular adenoma noted. Denies abdominal pain, black stools or blood in stool.   Remains on miralax and senna for constipation.   Past Medical History:  Diagnosis Date   Anxiety    Per [place] new patient packet   High blood pressure    Per   High cholesterol    Per [facility] new patient packet   IBS (irritable bowel syndrome)    Per [facility] new patient packet   OCD (obsessive compulsive disorder)    Per [facility] new patient packet   Prostate cancer ([place])    Per [place] new patient packet   Past Surgical History:  Procedure Laterality Date   TONSILECTOMY, ADENOIDECTOMY,  BILATERAL MYRINGOTOMY AND TUBES     Per [place] new patient packet    Allergies  Allergen Reactions   Nsaids Other (See Comments)    Kidney disease per Patient    Allergies as of 12/07/2022       Reactions   Nsaids Other (See Comments)   Kidney disease per Patient        Medication List        Accurate as of December 07, 2022  9:59 AM. If you have any questions, ask your nurse or doctor.          ARTIFICIAL TEARS OP Place 1 drop into both eyes 2 (two) times daily.   LORazepam 0.5 MG tablet Commonly known as: ATIVAN Take 0.5 mg by mouth 3 (three) times daily as needed for anxiety.   mirtazapine 15 MG tablet Commonly known as: REMERON Take 15 mg by mouth at bedtime.   polyethylene glycol 17 g packet Commonly known as: MIRALAX / GLYCOLAX Take 17 g by mouth daily.   PROBIOTIC GUMMIES PO Take 2 each by mouth daily.   risperiDONE 3 MG tablet Commonly known as: RISPERDAL Take 1 tablet by mouth once daily   sennosides-docusate sodium 8.6-50 MG tablet Commonly known as: SENOKOT-S Take 1 tablet by mouth at bedtime.   sertraline 100 MG tablet Commonly known as: ZOLOFT Take 200 mg by mouth at bedtime.   tamsulosin 0.4 MG Caps capsule Commonly known as: FLOMAX Take 0.8 mg by  mouth every evening.        Review of Systems  Constitutional:  Positive for fatigue. Negative for activity change and appetite change.  HENT:  Negative for congestion and trouble swallowing.   Eyes:  Negative for visual disturbance.  Respiratory:  Negative for cough, shortness of breath and wheezing.   Cardiovascular:  Negative for chest pain and leg swelling.  Gastrointestinal:  Positive for constipation and diarrhea. Negative for abdominal pain, blood in stool, nausea and rectal pain.  Genitourinary:  Negative for dysuria.  Musculoskeletal:  Positive for gait problem.  Skin:  Negative for wound.  Neurological:  Negative for dizziness, weakness and headaches.  Psychiatric/Behavioral:   Negative for confusion. The patient is nervous/anxious.     Immunization History  Administered Date(s) Administered   Influenza, High Dose Seasonal PF 07/16/2022   Influenza-Unspecified 07/12/2017, 07/26/2018, 09/18/2019, 07/16/2022   Moderna Sars-Covid-2 Vaccination 08/16/2022   Pneumococcal Conjugate PCV 7 08/06/2015   Pneumococcal Polysaccharide-23 08/13/2013   Pneumococcal-Unspecified 08/06/2015   Td 12/05/2008   Td (Adult) 12/05/2008   Pertinent  Health Maintenance Due  Topic Date Due   COLONOSCOPY (Pts 45-31yr Insurance coverage will need to be confirmed)  03/09/2032   INFLUENZA VACCINE  Completed      02/15/2022    9:35 PM 02/16/2022    9:27 AM 02/17/2022    2:02 AM 03/03/2022   11:14 AM 07/21/2022    8:05 AM  Fall Risk  Falls in the past year?    0   Was there an injury with Fall?    0 0  Fall Risk Category Calculator    0   Fall Risk Category (Retired)    Low   (RETIRED) Patient Fall Risk Level Low fall risk Moderate fall risk Low fall risk Low fall risk   Patient at Risk for Falls Due to    No Fall Risks   Fall risk Follow up    Falls evaluation completed    Functional Status Survey:    Vitals:   12/07/22 0954  BP: (!) 145/84  Pulse: 94  Resp: (!) 22  Temp: 98 F (36.7 C)  SpO2: 94%  Weight: 193 lb 3.2 oz (87.6 kg)  Height: '5\' 10"'$  (1.778 m)   Body mass index is 27.72 kg/m. Physical Exam Vitals reviewed.  Constitutional:      General: He is not in acute distress.    Comments: Pale complexion  HENT:     Head: Normocephalic.  Eyes:     General:        Right eye: No discharge.        Left eye: No discharge.  Cardiovascular:     Rate and Rhythm: Normal rate and regular rhythm.     Pulses: Normal pulses.     Heart sounds: Normal heart sounds.  Pulmonary:     Effort: Pulmonary effort is normal. No respiratory distress.     Breath sounds: Normal breath sounds. No wheezing.  Abdominal:     General: Bowel sounds are normal. There is no distension.      Palpations: There is no mass.     Tenderness: There is no abdominal tenderness. There is no guarding or rebound.     Hernia: No hernia is present.  Musculoskeletal:     Cervical back: Neck supple.     Right lower leg: No edema.     Left lower leg: No edema.  Skin:    General: Skin is warm and dry.  Capillary Refill: Capillary refill takes less than 2 seconds.  Neurological:     General: No focal deficit present.     Mental Status: He is alert and oriented to person, place, and time.     Gait: Gait abnormal.  Psychiatric:        Mood and Affect: Mood normal.     Comments: Flat affect     Labs reviewed: Recent Labs    02/03/22 1009 02/03/22 1009 02/09/22 2236 02/15/22 0530 11/08/22 2013 11/23/22 0000  NA 141  --  136 137 138 136*  K 3.9  --  4.1 3.7 4.5 4.3  CL 104  --  101 103 100 96*  CO2 26  --  25 21* 23* 26*  GLUCOSE 95  --  98 103*  --   --   BUN 21  --  24* '17 17 19  '$ CREATININE 1.35*   < > 1.50* 1.14 1.2 1.2  CALCIUM 9.3  --  9.3 9.3 9.1 8.8   < > = values in this interval not displayed.   Recent Labs    02/03/22 1009 02/09/22 2236 02/15/22 0530 11/23/22 0000  AST '14 23 22 19  '$ ALT 8* 13 14 8*  ALKPHOS  --  51 51 71  BILITOT 0.5 1.0 1.2  --   PROT 6.6 7.7 7.5  --   ALBUMIN  --  4.6 4.7 4.4   Recent Labs    02/09/22 2236 02/15/22 0526 11/08/22 2013 11/23/22 0000 12/06/22 0000  WBC 10.8* 7.2 6.6 5.0 4.8  NEUTROABS  --  5.7  --   --   --   HGB 14.5 14.5 11.2* 11.1* 10.0*  HCT 44.1 43.7 34* 35* 31*  MCV 89.1 88.5  --   --   --   PLT 194 156 183 187 163   Lab Results  Component Value Date   TSH 1.60 12/06/2022   No results found for: "HGBA1C" Lab Results  Component Value Date   CHOL 268 (A) 11/23/2022   HDL 51 11/23/2022   LDLCALC 192 11/23/2022   TRIG 127 11/23/2022   CHOLHDL 3.8 02/03/2022    Significant Diagnostic Results in last 30 days:  No results found.  Assessment/Plan 1. Low hemoglobin -Hgb 10.0 (02/26)> was 11.1 (02/13)>  11.2 (01/29)> 14.5 (02/2022) - recent hemoccults negative for blood - no sign of blood loss  - suspect GI as source or iron deficiency anemia - start Protonix 40 mg po daily  - recheck cbc/diff in 3 weeks - repeat hemoccult x 1   2. Iron deficiency anemia, unspecified iron deficiency anemia type - Hgb 10.0, hct 31.1, iron 19 (02/26) - start ferous sulfate 325 mg po daily   3. Other fatigue - TSH normal - see above  4. Slow transit constipation - cont miralax and senna    Family/ staff Communication: plan discussed with patient and nurse  Labs/tests ordered:   cbc/diff in 3 weeks

## 2022-12-20 ENCOUNTER — Non-Acute Institutional Stay: Payer: Medicare Other | Admitting: Adult Health

## 2022-12-20 ENCOUNTER — Encounter: Payer: Self-pay | Admitting: Adult Health

## 2022-12-20 VITALS — BP 136/84 | HR 82 | Temp 98.2°F | Resp 16 | Ht 70.0 in | Wt 197.8 lb

## 2022-12-20 DIAGNOSIS — K5901 Slow transit constipation: Secondary | ICD-10-CM

## 2022-12-20 DIAGNOSIS — E7849 Other hyperlipidemia: Secondary | ICD-10-CM

## 2022-12-20 DIAGNOSIS — D509 Iron deficiency anemia, unspecified: Secondary | ICD-10-CM

## 2022-12-20 DIAGNOSIS — K4021 Bilateral inguinal hernia, without obstruction or gangrene, recurrent: Secondary | ICD-10-CM

## 2022-12-20 DIAGNOSIS — D126 Benign neoplasm of colon, unspecified: Secondary | ICD-10-CM | POA: Insufficient documentation

## 2022-12-20 DIAGNOSIS — Z1159 Encounter for screening for other viral diseases: Secondary | ICD-10-CM

## 2022-12-20 NOTE — Progress Notes (Addendum)
Location:  [facility]  POS: Clinic  Provider: Royal Hawthorn, ANP  Code Status: Full  Goals of Care:     12/20/2022    1:27 PM  Advanced Directives  Does Patient Have a Medical Advance Directive? No  Would patient like information on creating a medical advance directive? No - Patient declined     Chief Complaint  Patient presents with   Medical Management of Chronic Issues    One month follow up with MMSE   Quality Metric Gaps    Discussed the need for AWV and Hep C screening    Immunizations    Discussed the need for shingles and tdap vaccine. [facility] verified    HPI: Patient is a 77 y.o. male seen today for f/u regarding anemia and also memory loss.   He started protonix and iron on 12/07/22 for iron deficiency anemia with Hgb 10 drop from 14, iron 19  Next CBC due 3/19 He was also having some fatigue at our last visit. TSH was normal.  Hemoccults 2/3 negative, one positive.  He feels he is full of gas and not having regular BMs. Last one was 3/11 but was very small. No nausea or vomiting. Having some pain on both sides of the abd, more so on the left. He says his stools are dark but he is on iron.   Also he is here for a repeat MMSE last one in 2023 was 30/30, repeat 12/20/22 28/30 2pts missed on recall, Has OCD and anxiety. Using risperdal, zoloft, ativan prn.   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    Has bilateral inguinal hernia, saw surgery 03/17/22 Dr Martha Clan recommended surgery, pt held off.  Past Medical History:  Diagnosis Date   Anxiety    Per [place] new patient packet   High blood pressure    Per   High cholesterol    Per [facility] new patient packet   IBS (irritable bowel syndrome)    Per [facility] new patient packet   OCD (obsessive compulsive disorder)    Per [facility] new patient packet   Prostate cancer ([place])    Per [place] new patient packet    Past Surgical History:  Procedure Laterality Date   TONSILECTOMY, ADENOIDECTOMY, BILATERAL MYRINGOTOMY AND TUBES      Per [place] new patient packet    Allergies  Allergen Reactions   Nsaids Other (See Comments)    Kidney disease per Patient    Outpatient Encounter Medications as of 12/20/2022  Medication Sig   Carboxymethylcellulose Sodium (ARTIFICIAL TEARS OP) Place 1 drop into both eyes 2 (two) times daily.   ferrous sulfate 325 (65 FE) MG EC tablet Take 325 mg by mouth in the morning.   LORazepam (ATIVAN) 0.5 MG tablet Take 0.5 mg by mouth 3 (three) times daily as needed for anxiety.   mirtazapine (REMERON) 15 MG tablet Take 15 mg by mouth at bedtime.   pantoprazole (PROTONIX) 40 MG tablet Take 40 mg by mouth daily.   polyethylene glycol (MIRALAX / GLYCOLAX) 17 g packet Take 17 g by mouth 2 (two) times daily.   Probiotic Product (PROBIOTIC GUMMIES PO) Take 2 each by mouth daily.   risperiDONE (RISPERDAL) 3 MG tablet Take 1 tablet by mouth once daily   sennosides-docusate sodium (SENOKOT-S) 8.6-50 MG tablet Take 1 tablet by mouth at bedtime.   sertraline (ZOLOFT) 100 MG tablet Take 200 mg by mouth at bedtime.   tamsulosin (FLOMAX) 0.4 MG CAPS capsule Take 0.8 mg by mouth every  evening.   No facility-administered encounter medications on file as of 12/20/2022.    Review of Systems:  Review of Systems  Constitutional:  Negative for activity change, appetite change, chills, diaphoresis, fatigue, fever and unexpected weight change.  Respiratory:  Negative for cough, shortness of breath, wheezing and stridor.   Cardiovascular:  Negative for chest pain, palpitations and leg swelling.  Gastrointestinal:  Positive for constipation. Negative for abdominal distention, abdominal pain, diarrhea and nausea.       Reported dark stools   Genitourinary:  Negative for difficulty urinating and dysuria.  Musculoskeletal:  Positive for gait problem. Negative for arthralgias, back pain, joint swelling and myalgias.  Neurological:  Negative for dizziness, seizures, syncope, facial asymmetry, speech difficulty, weakness  and headaches.  Hematological:  Negative for adenopathy. Does not bruise/bleed easily.  Psychiatric/Behavioral:  Positive for dysphoric mood. Negative for agitation, behavioral problems and confusion.     Health Maintenance  Topic Date Due   Medicare Annual Wellness (AWV)  Never done   Hepatitis C Screening  Never done   Zoster Vaccines- Shingrix (1 of 2) Never done   DTaP/Tdap/Td (3 - Tdap) 12/05/2018   COVID-19 Vaccine (2 - Moderna risk series) 01/02/2023 (Originally 09/13/2022)   COLONOSCOPY (Pts 45-43yr Insurance coverage will need to be confirmed)  03/09/2032   INFLUENZA VACCINE  Completed   HPV VACCINES  Aged Out   Pneumonia Vaccine 77 Years old  Discontinued    Physical Exam: Vitals:   12/20/22 1324  BP: 136/84  Pulse: 82  Resp: 16  Temp: 98.2 F (36.8 C)  TempSrc: Temporal  SpO2: 97%  Weight: 197 lb 12.8 oz (89.7 kg)  Height: '5\' 10"'$  (1.778 m)   Body mass index is 28.38 kg/m. Physical Exam Constitutional:      General: He is not in acute distress.    Appearance: He is not diaphoretic.  HENT:     Head: Normocephalic and atraumatic.  Neck:     Thyroid: No thyromegaly.     Vascular: No JVD.     Trachea: No tracheal deviation.  Cardiovascular:     Rate and Rhythm: Normal rate and regular rhythm.     Heart sounds: No murmur heard. Pulmonary:     Effort: Pulmonary effort is normal. No respiratory distress.     Breath sounds: Normal breath sounds. No wheezing.  Abdominal:     General: Bowel sounds are normal. There is distension (very milld, rotund).     Palpations: Abdomen is soft. There is no mass.     Tenderness: There is no abdominal tenderness. There is no right CVA tenderness, left CVA tenderness or guarding.     Comments: Bilateral inguinal hernia reducible No tenderness or swelling.   Lymphadenopathy:     Cervical: No cervical adenopathy.  Skin:    General: Skin is warm and dry.  Neurological:     Mental Status: He is alert and oriented to person,  place, and time.     Cranial Nerves: No cranial nerve deficit.  Psychiatric:     Comments: flat     Labs reviewed: Basic Metabolic Panel: Recent Labs    02/03/22 1009 02/03/22 1009 02/09/22 2236 02/15/22 0530 11/08/22 2013 11/23/22 0000 12/06/22 0000  NA 141  --  136 137 138 136*  --   K 3.9  --  4.1 3.7 4.5 4.3  --   CL 104  --  101 103 100 96*  --   CO2 26  --  25 21* 23*  26*  --   GLUCOSE 95  --  98 103*  --   --   --   BUN 21  --  24* '17 17 19  '$ --   CREATININE 1.35*   < > 1.50* 1.14 1.2 1.2  --   CALCIUM 9.3  --  9.3 9.3 9.1 8.8  --   TSH  --   --   --   --   --   --  1.60   < > = values in this interval not displayed.   Liver Function Tests: Recent Labs    02/03/22 1009 02/09/22 2236 02/15/22 0530 11/23/22 0000  AST '14 23 22 19  '$ ALT 8* 13 14 8*  ALKPHOS  --  51 51 71  BILITOT 0.5 1.0 1.2  --   PROT 6.6 7.7 7.5  --   ALBUMIN  --  4.6 4.7 4.4   No results for input(s): "LIPASE", "AMYLASE" in the last 8760 hours. No results for input(s): "AMMONIA" in the last 8760 hours. CBC: Recent Labs    02/09/22 2236 02/15/22 0526 11/08/22 2013 11/23/22 0000 12/06/22 0000  WBC 10.8* 7.2 6.6 5.0 4.8  NEUTROABS  --  5.7  --   --   --   HGB 14.5 14.5 11.2* 11.1* 10.0*  HCT 44.1 43.7 34* 35* 31*  MCV 89.1 88.5  --   --   --   PLT 194 156 183 187 163   Lipid Panel: Recent Labs    02/03/22 1009 11/23/22 0000  CHOL 211* 268*  HDL 56 51  LDLCALC 134* 192  TRIG 99 127  CHOLHDL 3.8  --    No results found for: "HGBA1C"  Procedures since last visit: No results found.  Assessment/Plan  1. Slow transit constipation He is having more constipation since starting iron Increase miralax to bid One dulcolax x 1 today   2. Adenoma of colon Colonoscopy June of 2023 with [facility], had polyps removed.  Needs repeat in 3 years.  Report found and will be scanned into epic.   3. Iron deficiency anemia, unspecified iron deficiency anemia type Continue on iron Also  on PPI 1/3 hemoccults was positive unclear accuracy Will have the nurse recheck and send the result to me Will need F/U with GI Consider CT of the abd F/U with Dr Lyndel Safe in 6 weeks.   4. Bilateral recurrent inguinal hernia without obstruction or gangrene Discussed with him that he should go back to surgery and discuss this further as they had recommended surgery.  He declined to make an apt at this time even though I educated him regarding the risk and some of the issues he is having may be overlapping  5. HLD Need to discuss lipid at next visit Has been gaining weight, LDL above goal    Ordered Hep C screening  Declined AWV visit.   Recommend shingrix vaccine    Labs/tests ordered:  * No order type specified *  CBC due 3/19 Next appt:  6 weeks with Dr Lyndel Safe.    Total time 71mn:  time greater than 50% of total time spent doing pt counseling and coordination of care

## 2022-12-28 LAB — CBC: RBC: 4.75 (ref 3.87–5.11)

## 2022-12-28 LAB — CBC AND DIFFERENTIAL
HCT: 39 — AB (ref 41–53)
Hemoglobin: 12.3 — AB (ref 13.5–17.5)
Platelets: 156 10*3/uL (ref 150–400)
WBC: 4.9

## 2023-01-05 ENCOUNTER — Other Ambulatory Visit: Payer: Self-pay | Admitting: Orthopedic Surgery

## 2023-01-05 DIAGNOSIS — F419 Anxiety disorder, unspecified: Secondary | ICD-10-CM

## 2023-01-05 MED ORDER — LORAZEPAM 0.5 MG PO TABS: 0.5000 mg | ORAL_TABLET | Freq: Three times a day (TID) | ORAL | 0 refills | Status: DC | PRN

## 2023-01-21 ENCOUNTER — Encounter: Payer: Self-pay | Admitting: Adult Health

## 2023-01-21 ENCOUNTER — Non-Acute Institutional Stay: Payer: Medicare Other | Admitting: Adult Health

## 2023-01-21 ENCOUNTER — Telehealth: Payer: Self-pay | Admitting: Adult Health

## 2023-01-21 DIAGNOSIS — K5901 Slow transit constipation: Secondary | ICD-10-CM | POA: Diagnosis not present

## 2023-01-21 DIAGNOSIS — U071 COVID-19: Secondary | ICD-10-CM

## 2023-01-21 DIAGNOSIS — D509 Iron deficiency anemia, unspecified: Secondary | ICD-10-CM

## 2023-01-21 DIAGNOSIS — R2681 Unsteadiness on feet: Secondary | ICD-10-CM

## 2023-01-21 MED ORDER — NIRMATRELVIR/RITONAVIR (PAXLOVID)TABLET: 3.0000 | ORAL_TABLET | Freq: Two times a day (BID) | ORAL | 0 refills | Status: AC

## 2023-01-21 MED ORDER — BISACODYL 10 MG RE SUPP: 10.0000 mg | RECTAL | 0 refills | Status: DC | PRN

## 2023-01-21 NOTE — Progress Notes (Signed)
Location:  [facility] Room Number: [phone] Place of Service:  ALF [phone]) Provider:  Tamsen Roers, MD  Patient Care Team: Mahlon Gammon, MD as PCP - General (Internal Medicine)  Extended Emergency Contact Information Primary Emergency Contact: Grewell,[place] Mobile Phone: [phone] Relation: Daughter Interpreter needed? No Secondary Emergency Contact: Garcia,David Mobile Phone: [phone] Relation: Son Interpreter needed? No  Code Status:  Full Code Goals of care: Advanced Directive information    01/21/2023    9:59 AM  Advanced Directives  Does Patient Have a Medical Advance Directive? No  Would patient like information on creating a medical advance directive? No - Patient declined     Chief Complaint  Patient presents with   Acute Visit    Patient is being seen because He has covid.   Quality Metric Gaps    Discuss need for AWV and Hep C    HPI:  Pt is a 77 y.o. male seen today for an acute visit for covid, weakness, constipation  Pt has constipation no BM for 2 days. Took supp then had small BM. ALso on miralax and senokot. Denies abd pain or vomiting. Feels some relief.   Also was having weakness and tested positive for covid by rapid antigen. No sob or cough. Sats ok on room air.   Has been having gait issues, shuffling and needing to hold on to the railings of the halls for several weeks. On risperdal for psych issues. Also at times feels dizzy. Not correlated with standing up. NO currently feeling dizzy but has in the past. No focal deficit issues. Working with therapy.   Currently he is on iron daily for iron def anemia. Hgb improved to 12.1    Hemoccults 2/3 negative, one positive.  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     Has bilateral inguinal hernia, saw surgery 03/17/22 Dr Thurman Coyer recommended surgery, pt held off.  Past Surgical History:  Procedure Laterality Date   TONSILECTOMY,  ADENOIDECTOMY, BILATERAL MYRINGOTOMY AND TUBES     Per [facility] new patient packet    Allergies  Allergen Reactions   Nsaids Other (See Comments)    Kidney disease per Patient    Outpatient Encounter Medications as of 01/21/2023  Medication Sig   Carboxymethylcellulose Sodium (ARTIFICIAL TEARS OP) Place 1 drop into both eyes 2 (two) times daily.   ferrous sulfate 325 (65 FE) MG EC tablet Take 325 mg by mouth in the morning.   LORazepam (ATIVAN) 0.5 MG tablet Take 1 tablet (0.5 mg total) by mouth 3 (three) times daily as needed for anxiety.   mirtazapine (REMERON) 15 MG tablet Take 15 mg by mouth at bedtime.   nirmatrelvir/ritonavir (PAXLOVID) 20 x 150 MG & 10 x 100MG  TABS Take 3 tablets by mouth 2 (two) times daily for 5 days. (Take nirmatrelvir 150 mg two tablets twice daily for 5 days and ritonavir 100 mg one tablet twice daily for 5 days) Patient GFR is 66   pantoprazole (PROTONIX) 40 MG tablet Take 40 mg by mouth daily.   polyethylene glycol (MIRALAX / GLYCOLAX) 17 g packet Take 17 g by mouth 2 (two) times daily.   Probiotic Product (PROBIOTIC GUMMIES PO) Take 2 each by mouth daily.   risperiDONE (RISPERDAL) 3 MG tablet Take 1 tablet by mouth once daily   sennosides-docusate sodium (SENOKOT-S) 8.6-50 MG tablet Take 1 tablet by mouth at bedtime.   sertraline (ZOLOFT) 100 MG tablet Take 200 mg by mouth at bedtime.  tamsulosin (FLOMAX) 0.4 MG CAPS capsule Take 0.8 mg by mouth every evening.   No facility-administered encounter medications on file as of 01/21/2023.    Review of Systems  Constitutional:  Positive for activity change and fatigue. Negative for appetite change, chills, diaphoresis, fever and unexpected weight change.  Respiratory:  Negative for cough, shortness of breath, wheezing and stridor.   Cardiovascular:  Negative for chest pain, palpitations and leg swelling.  Gastrointestinal:  Positive for constipation. Negative for abdominal distention, abdominal pain, blood in  stool, diarrhea, nausea, rectal pain and vomiting.  Genitourinary:  Negative for difficulty urinating and dysuria.  Musculoskeletal:  Positive for gait problem. Negative for arthralgias, back pain, joint swelling and myalgias.  Neurological:  Positive for dizziness and weakness (general). Negative for seizures, syncope, facial asymmetry, speech difficulty and headaches.  Hematological:  Negative for adenopathy. Does not bruise/bleed easily.  Psychiatric/Behavioral:  Negative for agitation, behavioral problems and confusion.     Immunization History  Administered Date(s) Administered   Influenza, High Dose Seasonal PF 07/16/2022   Influenza-Unspecified 07/12/2017, 07/26/2018, 09/18/2019, 07/16/2022   Moderna Sars-Covid-2 Vaccination 08/16/2022   Pneumococcal Conjugate PCV 7 08/06/2015   Pneumococcal Polysaccharide-23 08/13/2013   Pneumococcal-Unspecified 08/06/2015   Td 12/05/2008   Td (Adult) 12/05/2008   Tdap 11/24/2022   Pertinent  Health Maintenance Due  Topic Date Due   INFLUENZA VACCINE  05/12/2023   COLONOSCOPY (Pts 45-37yrs Insurance coverage will need to be confirmed)  03/09/2032      02/16/2022    9:27 AM 02/17/2022    2:02 AM 03/03/2022   11:14 AM 07/21/2022    8:05 AM 12/20/2022    1:26 PM  Fall Risk  Falls in the past year?   0  0  Was there an injury with Fall?   0 0 0  Fall Risk Category Calculator   0  0  Fall Risk Category (Retired)   Low    (RETIRED) Patient Fall Risk Level Moderate fall risk Low fall risk Low fall risk    Patient at Risk for Falls Due to   No Fall Risks    Fall risk Follow up   Falls evaluation completed  Falls evaluation completed   Functional Status Survey:    Vitals:   01/21/23 0953  BP: (!) 143/87  Pulse: 92  Resp: 18  Temp: (!) 96.8 F (36 C)  TempSrc: Temporal  SpO2: 94%  Weight: 196 lb 6.4 oz (89.1 kg)  Height: 5\' 10"  (1.778 m)   Body mass index is 28.18 kg/m. Physical Exam Constitutional:      General: He is not in  acute distress.    Appearance: He is not diaphoretic.  HENT:     Head: Normocephalic and atraumatic.     Nose: Congestion present.     Mouth/Throat:     Mouth: Mucous membranes are moist.     Pharynx: Oropharynx is clear.  Eyes:     Conjunctiva/sclera: Conjunctivae normal.     Pupils: Pupils are equal, round, and reactive to light.  Neck:     Thyroid: No thyromegaly.     Vascular: No JVD.     Trachea: No tracheal deviation.  Cardiovascular:     Rate and Rhythm: Normal rate and regular rhythm.     Heart sounds: No murmur heard. Pulmonary:     Effort: Pulmonary effort is normal. No respiratory distress.     Breath sounds: Normal breath sounds. No wheezing.  Abdominal:     General: Bowel sounds  are normal. There is no distension.     Palpations: Abdomen is soft.     Tenderness: There is no abdominal tenderness. There is no guarding.  Musculoskeletal:     Comments: +1 BLE edema   Lymphadenopathy:     Cervical: No cervical adenopathy.  Skin:    General: Skin is warm and dry.  Neurological:     General: No focal deficit present.     Mental Status: He is alert and oriented to person, place, and time. Mental status is at baseline.     Comments: Shuffling gait, stead slow  Psychiatric:     Comments: flat     Labs reviewed: Recent Labs    02/03/22 1009 02/03/22 1009 02/09/22 2236 02/15/22 0530 11/08/22 2013 11/23/22 0000  NA 141  --  136 137 138 136*  K 3.9  --  4.1 3.7 4.5 4.3  CL 104  --  101 103 100 96*  CO2 26  --  25 21* 23* 26*  GLUCOSE 95  --  98 103*  --   --   BUN 21  --  24* CREATININE 1.35*   < > 1.50* 1.14 1.2 1.2  CALCIUM 9.3  --  9.3 9.3 9.1 8.8   < > = values in this interval not displayed.   Recent Labs    02/03/22 1009 02/09/22 2236 02/15/22 0530 11/23/22 0000  AST ALT 8* 13 14 8*  ALKPHOS  --  51 51 71  BILITOT 0.5 1.0 1.2  --   PROT 6.6 7.7 7.5  --   ALBUMIN  --  4.6 4.7 4.4   Recent Labs    02/09/22 2236  02/15/22 0526 11/08/22 2013 11/23/22 0000 12/06/22 0000  WBC 10.8* 7.2 6.6 5.0 4.8  NEUTROABS  --  5.7  --   --   --   HGB 14.5 14.5 11.2* 11.1* 10.0*  HCT 44.1 43.7 34* 35* 31*  MCV 89.1 88.5  --   --   --   PLT 194 156 183 187 163   Lab Results  Component Value Date   TSH 1.60 12/06/2022   No results found for: "HGBA1C" Lab Results  Component Value Date   CHOL 268 (A) 11/23/2022   HDL 51 11/23/2022   LDLCALC 192 11/23/2022   TRIG 127 11/23/2022   CHOLHDL 3.8 02/03/2022    Significant Diagnostic Results in last 30 days:  No results found.  Assessment/Plan  1. COVID-19 virus infection Paxlovid x 5 days Covid isolation x 10 days Vs qshift Encourage oral fluid.   2. Iron deficiency anemia, unspecified iron deficiency anemia type Improved Reduce iron to [facility]  3. Slow transit constipation Improved Will reduce iron Continue miralax and senokot Add dulcolax qd prn  4. Gait instability Check orthostatic bp/pulse Working with therapy Recent labs ok Suspect this could be due to cognitive issues and medications for behaviors.    Family/ staff Communication: resident   Labs/tests ordered:  NA

## 2023-01-21 NOTE — Telephone Encounter (Signed)
Pt tested positive for Covid by rapid antigen. Feeling tired, weak, reduced appetite. Paxlovid prescribed. Enhanced isolation x 10 days with VS qshift. Encourage oral fluid.

## 2023-01-26 ENCOUNTER — Non-Acute Institutional Stay: Payer: Medicare Other | Admitting: Orthopedic Surgery

## 2023-01-26 DIAGNOSIS — R109 Unspecified abdominal pain: Secondary | ICD-10-CM | POA: Diagnosis not present

## 2023-01-26 DIAGNOSIS — K4021 Bilateral inguinal hernia, without obstruction or gangrene, recurrent: Secondary | ICD-10-CM

## 2023-01-26 DIAGNOSIS — U071 COVID-19: Secondary | ICD-10-CM

## 2023-01-26 DIAGNOSIS — F429 Obsessive-compulsive disorder, unspecified: Secondary | ICD-10-CM

## 2023-01-26 DIAGNOSIS — K644 Residual hemorrhoidal skin tags: Secondary | ICD-10-CM

## 2023-01-27 ENCOUNTER — Encounter: Payer: Self-pay | Admitting: Orthopedic Surgery

## 2023-01-27 NOTE — Progress Notes (Signed)
Location:  [facility] Room Number: 610/A Place of Service:  ALF [phone]) Provider:  Octavia Heir, NP   Mahlon Gammon, MD  Patient Care Team: Mahlon Gammon, MD as PCP - General (Internal Medicine)  Extended Emergency Contact Information Primary Emergency Contact: Andalon,[place] Mobile Phone: [phone] Relation: Daughter Interpreter needed? No Secondary Emergency Contact: Litchford,David Mobile Phone: [phone] Relation: Son Interpreter needed? No  Code Status:  Full code Goals of care: Advanced Directive information    01/21/2023    9:59 AM  Advanced Directives  Does Patient Have a Medical Advance Directive? No  Would patient like information on creating a medical advance directive? No - Patient declined     Chief Complaint  Patient presents with   Acute Visit    Abdomina and rectal pain    HPI:  Pt is a 77 y.o. male seen today for acute visit due to ongoing abdominal and rectal pain.   He currently resides on the assisted living unit at [facility]. PMH: HTN, IBS, prostate cancer, anxiety and OCD.   Ongoing abdominal pain since 10/2022. 02/27 he was diagnosed with iron deficiency anemia> hgb dropped to 10 from 14. 2/3 hemoccults were negative. During this time he was also having intermittent constipation and bloating. Miralax was increased to BID. He is also on senna. Today, he reports increased abdominal pain. He also reports rectal pain when using bathroom. He states" my stool is coming out sideways and it hurts." He cannot recall when he had a normal BM. He is having 1-2 episodes daily, described as loose. Diet varies, but does not always eat fruits and vegetables daily. In addition, he tested positive for covid 04/12> minimal symptoms> on Paxlovid. He was very anxious during encounter. I performed a rectal exam in his room. After exam, he was upset I did not advance further with my finger. He requested me to perform another rectal exam.  I declined. I explained me reasoning for declining and told him my plan of care. He was agreeable to have KUB and lidocaine for hemorrhoids.   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   H/o bilateral inguinal hernia. Followed by general surgery> surgery recommended. We have advised him to see general surgery due to increasing abdominal pain but he has not scheduled appointment.   H/o anxiety with OCD. He is follow by psychiatry. Remains on ativan, risperidone and Zoloft.      Past Medical History:  Diagnosis Date   Anxiety    Per [facility] new patient packet   High blood pressure    Per   High cholesterol    Per [facility] new patient packet   IBS (irritable bowel syndrome)    Per [facility] new patient packet   OCD (obsessive compulsive disorder)    Per [facility] new patient packet   Prostate cancer    Per [facility] new patient packet   Past Surgical History:  Procedure Laterality Date   TONSILECTOMY, ADENOIDECTOMY, BILATERAL MYRINGOTOMY AND TUBES     Per [facility] new patient packet    Allergies  Allergen Reactions   Nsaids Other (See Comments)    Kidney disease per Patient    Outpatient Encounter Medications as of 01/26/2023  Medication Sig   bisacodyl (DULCOLAX) 10 MG suppository Place 1 suppository (10 mg total) rectally as needed for moderate constipation.   Carboxymethylcellulose Sodium (ARTIFICIAL TEARS OP) Place 1 drop into both eyes 2 (two) times daily.   ferrous sulfate 325 (65 FE) MG EC tablet Take 325  mg by mouth 3 (three) times a week.   LORazepam (ATIVAN) 0.5 MG tablet Take 1 tablet (0.5 mg total) by mouth 3 (three) times daily as needed for anxiety.   mirtazapine (REMERON) 15 MG tablet Take 15 mg by mouth at bedtime.   [EXPIRED] nirmatrelvir/ritonavir (PAXLOVID) 20 x 150 MG & 10 x 100MG  TABS Take 3 tablets by mouth 2 (two) times daily for 5 days. (Take nirmatrelvir 150 mg two tablets twice daily for 5 days and ritonavir 100 mg one tablet twice daily for 5 days) Patient GFR is 66   pantoprazole  (PROTONIX) 40 MG tablet Take 40 mg by mouth daily.   polyethylene glycol (MIRALAX / GLYCOLAX) 17 g packet Take 17 g by mouth 2 (two) times daily.   Probiotic Product (PROBIOTIC GUMMIES PO) Take 2 each by mouth daily.   risperiDONE (RISPERDAL) 3 MG tablet Take 1 tablet by mouth once daily   sennosides-docusate sodium (SENOKOT-S) 8.6-50 MG tablet Take 1 tablet by mouth at bedtime.   sertraline (ZOLOFT) 100 MG tablet Take 200 mg by mouth at bedtime.   tamsulosin (FLOMAX) 0.4 MG CAPS capsule Take 0.8 mg by mouth every evening.   No facility-administered encounter medications on file as of 01/26/2023.    Review of Systems  Constitutional:  Negative for activity change and appetite change.  HENT:  Positive for congestion. Negative for sore throat.   Eyes:  Negative for visual disturbance.  Respiratory:  Positive for cough. Negative for shortness of breath and wheezing.   Cardiovascular:  Negative for chest pain and leg swelling.  Gastrointestinal:  Positive for abdominal pain, constipation, diarrhea, nausea, rectal pain and vomiting. Negative for abdominal distention and blood in stool.  Genitourinary:  Negative for dysuria.  Musculoskeletal:  Negative for gait problem.  Skin:  Negative for wound.  Neurological:  Negative for dizziness, weakness and headaches.  Psychiatric/Behavioral:  Negative for confusion, dysphoric mood and sleep disturbance. The patient is nervous/anxious.     Immunization History  Administered Date(s) Administered   Influenza, High Dose Seasonal PF 07/16/2022   Influenza-Unspecified 07/12/2017, 07/26/2018, 09/18/2019, 07/16/2022   Moderna Sars-Covid-2 Vaccination 08/16/2022   Pneumococcal Conjugate PCV 7 08/06/2015   Pneumococcal Polysaccharide-23 08/13/2013   Pneumococcal-Unspecified 08/06/2015   Td 12/05/2008   Td (Adult) 12/05/2008   Tdap 11/24/2022   Pertinent  Health Maintenance Due  Topic Date Due   INFLUENZA VACCINE  05/12/2023   COLONOSCOPY (Pts  45-28yrs Insurance coverage will need to be confirmed)  03/09/2032      02/16/2022    9:27 AM 02/17/2022    2:02 AM 03/03/2022   11:14 AM 07/21/2022    8:05 AM 12/20/2022    1:26 PM  Fall Risk  Falls in the past year?   0  0  Was there an injury with Fall?   0 0 0  Fall Risk Category Calculator   0  0  Fall Risk Category (Retired)   Low    (RETIRED) Patient Fall Risk Level Moderate fall risk Low fall risk Low fall risk    Patient at Risk for Falls Due to   No Fall Risks    Fall risk Follow up   Falls evaluation completed  Falls evaluation completed   Functional Status Survey:    Vitals:   01/26/23 2055  BP: 133/83  Pulse: 88  Resp: 14  Temp: (!) 97 F (36.1 C)  SpO2: 97%  Weight: 196 lb 6.4 oz (89.1 kg)  Height: 5\' 10"  (1.778 m)  Body mass index is 28.18 kg/m. Physical Exam Vitals reviewed.  Constitutional:      General: He is not in acute distress. HENT:     Head: Normocephalic.     Nose: Nose normal.     Mouth/Throat:     Mouth: Mucous membranes are moist.  Eyes:     General:        Right eye: No discharge.        Left eye: No discharge.  Cardiovascular:     Rate and Rhythm: Normal rate and regular rhythm.     Pulses: Normal pulses.     Heart sounds: Normal heart sounds.  Pulmonary:     Effort: Pulmonary effort is normal. No respiratory distress.     Breath sounds: Normal breath sounds. No wheezing.  Abdominal:     General: Bowel sounds are normal. There is no distension.     Palpations: Abdomen is soft. There is no mass.     Tenderness: There is no abdominal tenderness. There is no guarding or rebound.     Hernia: A hernia is present.     Comments: Abdomen soft/non-tender, hernias not protruding, left side of abdomen appears larger than right.   Genitourinary:    Rectum: External hemorrhoid present. No mass, tenderness or internal hemorrhoid.     Comments: 2 small external hemorrhoids Musculoskeletal:     Cervical back: Neck supple.     Right lower leg:  Edema present.     Left lower leg: Edema present.     Comments: Non pitting  Skin:    General: Skin is warm.     Capillary Refill: Capillary refill takes less than 2 seconds.  Neurological:     General: No focal deficit present.     Mental Status: He is alert and oriented to person, place, and time.  Psychiatric:        Mood and Affect: Mood is anxious.     Labs reviewed: Recent Labs    02/03/22 1009 02/03/22 1009 02/09/22 2236 02/15/22 0530 11/08/22 2013 11/23/22 0000  NA 141  --  136 137 138 136*  K 3.9  --  4.1 3.7 4.5 4.3  CL 104  --  101 103 100 96*  CO2 26  --  25 21* 23* 26*  GLUCOSE 95  --  98 103*  --   --   BUN 21  --  24* CREATININE 1.35*   < > 1.50* 1.14 1.2 1.2  CALCIUM 9.3  --  9.3 9.3 9.1 8.8   < > = values in this interval not displayed.   Recent Labs    02/03/22 1009 02/09/22 2236 02/15/22 0530 11/23/22 0000  AST ALT 8* 13 14 8*  ALKPHOS  --  51 51 71  BILITOT 0.5 1.0 1.2  --   PROT 6.6 7.7 7.5  --   ALBUMIN  --  4.6 4.7 4.4   Recent Labs    02/09/22 2236 02/15/22 0526 11/08/22 2013 11/23/22 0000 12/06/22 0000 12/28/22 0000  WBC 10.8* 7.2   < > 5.0 4.8 4.9  NEUTROABS  --  5.7  --   --   --   --   HGB 14.5 14.5   < > 11.1* 10.0* 12.3*  HCT 44.1 43.7   < > 35* 31* 39*  MCV 89.1 88.5  --   --   --   --   PLT 194 156   < >  187 163 156   < > = values in this interval not displayed.   Lab Results  Component Value Date   TSH 1.60 12/06/2022   No results found for: "HGBA1C" Lab Results  Component Value Date   CHOL 268 (A) 11/23/2022   HDL 51 11/23/2022   LDLCALC 192 11/23/2022   TRIG 127 11/23/2022   CHOLHDL 3.8 02/03/2022    Significant Diagnostic Results in last 30 days:  No results found.  Assessment/Plan 1. Abdominal pain, unspecified abdominal location - ongoing - suspect associated with bilateral hernias or related to OCD - averaging 1-2 BM daily, described as loose - on miralax and senna - 2023  colonoscopy revealed tubular adenoma - KUB> unremarkable  2. Hemorrhoids, external without complications - 2 external hernias present on rectal exam  - start Anusol BID x 7 days - cont Senna - encourage hydration  3. Bilateral recurrent inguinal hernia without obstruction or gangrene - followed by general surgery - surgery recommended in past - recommend rescheduling with general surgery   4. Obsessive-compulsive disorder, unspecified type - followed by psychiatry - increased anxiety during exam> requested second rectal exam - ongoing complaints of abdominal pain, constipation since 10/2022 - cont ativan, risperidone and Zoloft - recommend psychiatry consult with [facility] or f/u with Dr. Betti Cruz  5. COVID-19 virus infection - 04/12 tested positive - minimal symptoms - cont Paxlovid    Family/ staff Communication: plan discussed with patient and nurse  Labs/tests ordered:  KUB

## 2023-01-31 ENCOUNTER — Encounter: Payer: Medicare Other | Admitting: Adult Health

## 2023-01-31 ENCOUNTER — Other Ambulatory Visit: Payer: Self-pay | Admitting: Internal Medicine

## 2023-01-31 DIAGNOSIS — F419 Anxiety disorder, unspecified: Secondary | ICD-10-CM

## 2023-01-31 MED ORDER — LORAZEPAM 0.5 MG PO TABS: 0.5000 mg | ORAL_TABLET | Freq: Three times a day (TID) | ORAL | 0 refills | Status: DC | PRN

## 2023-02-01 ENCOUNTER — Encounter: Payer: Self-pay | Admitting: Internal Medicine

## 2023-02-01 ENCOUNTER — Non-Acute Institutional Stay: Payer: Medicare Other | Admitting: Internal Medicine

## 2023-02-01 VITALS — BP 136/78 | HR 109 | Temp 97.8°F | Resp 17 | Ht 70.0 in | Wt 197.4 lb

## 2023-02-01 DIAGNOSIS — K581 Irritable bowel syndrome with constipation: Secondary | ICD-10-CM | POA: Diagnosis not present

## 2023-02-01 DIAGNOSIS — D509 Iron deficiency anemia, unspecified: Secondary | ICD-10-CM

## 2023-02-01 DIAGNOSIS — R109 Unspecified abdominal pain: Secondary | ICD-10-CM | POA: Diagnosis not present

## 2023-02-01 DIAGNOSIS — F429 Obsessive-compulsive disorder, unspecified: Secondary | ICD-10-CM

## 2023-02-01 DIAGNOSIS — F419 Anxiety disorder, unspecified: Secondary | ICD-10-CM

## 2023-02-01 DIAGNOSIS — K4021 Bilateral inguinal hernia, without obstruction or gangrene, recurrent: Secondary | ICD-10-CM

## 2023-02-06 NOTE — Progress Notes (Signed)
Location:  [facility] features editor of Service:  Clinic (12)  Provider:   Code Status:  Goals of Care:     02/01/2023    1:44 PM  Advanced Directives  Does Patient Have a Medical Advance Directive? No  Would patient like information on creating a medical advance directive? No - Patient declined     Chief Complaint  Patient presents with   Medical Management of Chronic Issues    6 week follow up   Health Maintenance    Patient is due for AWV and Hep C Screening      HPI: Patient is a 77 y.o. male seen today for medical management of chronic diseases.   Lives in [place]    Acute issue today was constipation Says his bowels are not normal He feels distended Bloated Rectal pain Wants something stronger to help move his bowels/ Wants Suppositories  Just recovered from Covid And isolation gave him more anxiety  Was seen by Amy KUB was done Shows Moderate stool in colon  Colonoscopy in 2023 showed Tubular Adenoma Bilateral Inguinal Hernia Plan to go ahead with surgery as recommended with Surgery Walks with no issue Flat Effect Also Noticed to have Anemia HGB drop from 14 To 10 recently 3 hemoccult negative One positive TSH normal IROn 19 On iron supplement now    Was living by himself in [place] after his Wife died  Started having  Anxiety and OCD behaviors  Moved to [place] by his only son Establish with Dr Betti Cruz   Also has h/o Prostate Cancer 6 years ago s/p Radiation therapy Seeing Dr Arita Miss in Urology IBS  Establish with [facility] HLD and HTN   MMSE 28/30 Passed his clock drawing Walks with no assist    Past Medical History:  Diagnosis Date   Anxiety    Per [facility] new patient packet   High blood pressure    Per   High cholesterol    Per [facility] new patient packet   IBS (irritable bowel syndrome)    Per [facility] new patient packet   OCD (obsessive compulsive disorder)    Per [facility] new patient packet   Prostate cancer (HCC)    Per  [facility] new patient packet    Past Surgical History:  Procedure Laterality Date   TONSILECTOMY, ADENOIDECTOMY, BILATERAL MYRINGOTOMY AND TUBES     Per [facility] new patient packet    Allergies  Allergen Reactions   Nsaids Other (See Comments)    Kidney disease per Patient    Outpatient Encounter Medications as of 02/01/2023  Medication Sig   bisacodyl (DULCOLAX) 10 MG suppository Place 1 suppository (10 mg total) rectally as needed for moderate constipation.   Carboxymethylcellulose Sodium (ARTIFICIAL TEARS OP) Place 1 drop into both eyes 2 (two) times daily.   ferrous sulfate 325 (65 FE) MG EC tablet Take 325 mg by mouth 3 (three) times a week.   [EXPIRED] hydrocortisone (ANUSOL-HC) 2.5 % rectal cream Apply 1 Application topically 2 (two) times daily.   LORazepam (ATIVAN) 0.5 MG tablet Take 1 tablet (0.5 mg total) by mouth 3 (three) times daily as needed for anxiety.   mirtazapine (REMERON) 15 MG tablet Take 15 mg by mouth at bedtime.   pantoprazole (PROTONIX) 40 MG tablet Take 40 mg by mouth daily.   polyethylene glycol (MIRALAX / GLYCOLAX) 17 g packet Take 17 g by mouth 2 (two) times daily.   Probiotic Product (PROBIOTIC GUMMIES PO) Take 2 each by  mouth daily.   risperiDONE (RISPERDAL) 3 MG tablet Take 1 tablet by mouth once daily   sennosides-docusate sodium (SENOKOT-S) 8.6-50 MG tablet Take 1 tablet by mouth at bedtime.   sertraline (ZOLOFT) 100 MG tablet Take 200 mg by mouth at bedtime.   simethicone (MYLICON) 125 MG chewable tablet Chew 125 mg by mouth 2 (two) times daily as needed for flatulence.   tamsulosin (FLOMAX) 0.4 MG CAPS capsule Take 0.8 mg by mouth every evening.   No facility-administered encounter medications on file as of 02/01/2023.    Review of Systems:  Review of Systems  Constitutional:  Negative for activity change, appetite change and unexpected weight change.  HENT: Negative.    Respiratory:  Negative for cough and shortness of breath.   Cardiovascular:   Negative for leg swelling.  Gastrointestinal:  Positive for abdominal distention and constipation.  Genitourinary:  Negative for frequency.  Musculoskeletal:  Negative for arthralgias, gait problem and myalgias.  Skin: Negative.  Negative for rash.  Neurological:  Negative for dizziness and weakness.  Psychiatric/Behavioral:  Positive for confusion and dysphoric mood. Negative for sleep disturbance. The patient is nervous/anxious.   All other systems reviewed and are negative.   Health Maintenance  Topic Date Due   Medicare Annual Wellness (AWV)  Never done   Hepatitis C Screening  Never done   COVID-19 Vaccine (2 - Moderna risk series) 02/06/2023 (Originally 09/13/2022)   Zoster Vaccines- Shingrix (1 of 2) 03/25/2023 (Originally 06/19/1965)   INFLUENZA VACCINE  05/12/2023   COLONOSCOPY (Pts 45-18yrs Insurance coverage will need to be confirmed)  03/09/2032   DTaP/Tdap/Td (4 - Td or Tdap) 11/24/2032   HPV VACCINES  Aged Out   Pneumonia Vaccine 79+ Years old  Discontinued    Physical Exam: Vitals:   02/01/23 1342  BP: 136/78  Pulse: (!) 109  Resp: 17  Temp: 97.8 F (36.6 C)  TempSrc: Temporal  SpO2: 98%  Weight: 197 lb 6.4 oz (89.5 kg)  Height: 5\' 10"  (1.778 m)   Body mass index is 28.32 kg/m. Physical Exam Vitals reviewed.  Constitutional:      Appearance: Normal appearance.  HENT:     Head: Normocephalic.     Nose: Nose normal.     Mouth/Throat:     Mouth: Mucous membranes are moist.     Pharynx: Oropharynx is clear.  Eyes:     Pupils: Pupils are equal, round, and reactive to light.  Cardiovascular:     Rate and Rhythm: Normal rate and regular rhythm.     Pulses: Normal pulses.     Heart sounds: No murmur heard. Pulmonary:     Effort: Pulmonary effort is normal. No respiratory distress.     Breath sounds: Normal breath sounds. No rales.  Abdominal:     General: Abdomen is flat. Bowel sounds are normal. There is no distension.     Palpations: Abdomen is soft.      Tenderness: There is no abdominal tenderness. There is no guarding.  Musculoskeletal:        General: No swelling.     Cervical back: Neck supple.  Skin:    General: Skin is warm.  Neurological:     General: No focal deficit present.     Mental Status: He is alert and oriented to person, place, and time.  Psychiatric:        Mood and Affect: Mood normal.        Thought Content: Thought content normal.     Labs  reviewed: Basic Metabolic Panel: Recent Labs    02/09/22 2236 02/15/22 0530 11/08/22 2013 11/23/22 0000 12/06/22 0000  NA 136 137 138 136*  --   K 4.1 3.7 4.5 4.3  --   CL 101 103 100 96*  --   CO2 25 21* 23* 26*  --   GLUCOSE 98 103*  --   --   --   BUN 24* 17 17 19   --   CREATININE 1.50* 1.14 1.2 1.2  --   CALCIUM 9.3 9.3 9.1 8.8  --   TSH  --   --   --   --  1.60   Liver Function Tests: Recent Labs    02/09/22 2236 02/15/22 0530 11/23/22 0000  AST 23 22 19   ALT 13 14 8*  ALKPHOS 51 51 71  BILITOT 1.0 1.2  --   PROT 7.7 7.5  --   ALBUMIN 4.6 4.7 4.4   No results for input(s): "LIPASE", "AMYLASE" in the last 8760 hours. No results for input(s): "AMMONIA" in the last 8760 hours. CBC: Recent Labs    02/09/22 2236 02/15/22 0526 11/08/22 2013 11/23/22 0000 12/06/22 0000 12/28/22 0000  WBC 10.8* 7.2   < > 5.0 4.8 4.9  NEUTROABS  --  5.7  --   --   --   --   HGB 14.5 14.5   < > 11.1* 10.0* 12.3*  HCT 44.1 43.7   < > 35* 31* 39*  MCV 89.1 88.5  --   --   --   --   PLT 194 156   < > 187 163 156   < > = values in this interval not displayed.   Lipid Panel: Recent Labs    11/23/22 0000  CHOL 268*  HDL 51  LDLCALC 192  TRIG 161   No results found for: "HGBA1C"  Procedures since last visit: No results found.  Assessment/Plan 1. Irritable bowel syndrome with constipation Possible worsening with adding Iron Will start on Amitiza and see if it helps He does have follow up with GI Also on Miralax and Senna 2. Abdominal pain,  unspecified abdominal location KUB showed Constipation   3. Bilateral recurrent inguinal hernia without obstruction or gangrene Is going to see Surgery  4. Obsessive-compulsive disorder, unspecified type Risperdal Remeron and Zoloft per Dr Betti Cruz  5. Anxiety On Ativan per Dr Betti Cruz  6. Iron deficiency anemia, unspecified iron deficiency anemia type Patient has had Negative colonoscopy in 2023 On Iron supplement now Repeat CBC is 12.3 Will reduce iron to 2/week 7 HLD Needs statin Discuss next visit after his surgery 8 Refused Shingrix Labs/tests ordered:  * No order type specified * Next appt:  03/28/2023

## 2023-02-11 ENCOUNTER — Ambulatory Visit
Admission: RE | Admit: 2023-02-11 | Discharge: 2023-02-11 | Disposition: A | Discharge status: Home / Self Care | Payer: Medicare Other | Source: Ambulatory Visit | Attending: Gastroenterology | Admitting: Gastroenterology

## 2023-02-11 ENCOUNTER — Other Ambulatory Visit: Payer: Self-pay | Admitting: Gastroenterology

## 2023-02-11 DIAGNOSIS — R140 Abdominal distension (gaseous): Secondary | ICD-10-CM

## 2023-03-09 ENCOUNTER — Encounter (HOSPITAL_COMMUNITY): Payer: Self-pay

## 2023-03-14 NOTE — Progress Notes (Signed)
Sent message, via epic in basket, requesting orders in epic from surgeon.  

## 2023-03-15 ENCOUNTER — Ambulatory Visit: Payer: Self-pay | Admitting: General Surgery

## 2023-03-17 NOTE — Patient Instructions (Signed)
SURGICAL WAITING ROOM VISITATION  Patients having surgery or a procedure may have no more than 2 support people in the waiting area - these visitors may rotate.    Children under the age of 24 must have an adult with them who is not the patient.  Due to an increase in RSV and influenza rates and associated hospitalizations, children ages 56 and under may not visit patients in [facility] hospitals.  If the patient needs to stay at the hospital during part of their recovery, the visitor guidelines for inpatient rooms apply. Pre-op nurse will coordinate an appropriate time for 1 support person to accompany patient in pre-op.  This support person may not rotate.    Please refer to the [facility] website for the visitor guidelines for Inpatients (after your surgery is over and you are in a regular room).       Your procedure is scheduled on: 04/01/23   Report to [facility] Main Entrance    Report to admitting at 8:45 AM   Call this number if you have problems the morning of surgery [phone]   Do not eat food :After Midnight.   After Midnight you may have the following liquids until  8 AM DAY OF SURGERY  Water Non-Citrus Juices (without pulp, NO RED-Apple, White grape, White cranberry) Black Coffee (NO MILK/CREAM OR CREAMERS, sugar ok)  Clear Tea (NO MILK/CREAM OR CREAMERS, sugar ok) regular and decaf                             Plain Jell-O (NO RED)                                           Fruit ices (not with fruit pulp, NO RED)                                     Popsicles (NO RED)                                                               Sports drinks like Gatorade (NO RED)                 FOLLOW BOWEL PREP AND ANY ADDITIONAL PRE OP INSTRUCTIONS YOU RECEIVED FROM YOUR SURGEON'S OFFICE!!!     Oral Hygiene is also important to reduce your risk of infection.                                    Remember - BRUSH YOUR TEETH THE MORNING OF SURGERY WITH YOUR REGULAR  TOOTHPASTE  DENTURES WILL BE REMOVED PRIOR TO SURGERY PLEASE DO NOT APPLY "Poly grip" OR ADHESIVES!!!   Do NOT smoke after Midnight   Take these medicines the morning of surgery: Pantoprazole(Protonix), Simethicone(mylicon), Ativan(Lorazepam)   Bring CPAP mask and tubing day of surgery.  You may not have any metal on your body including hair pins, jewelry, and body piercing             Do not wear make-up, lotions, powders,/cologne, or deodorant              Men may shave face and neck.   Do not bring valuables to the hospital. [facility] IS NOT             RESPONSIBLE   FOR VALUABLES.   Contacts, glasses, dentures or bridgework may not be worn into surgery.  DO NOT BRING YOUR HOME MEDICATIONS TO THE HOSPITAL. PHARMACY WILL DISPENSE MEDICATIONS LISTED ON YOUR MEDICATION LIST TO YOU DURING YOUR ADMISSION IN THE HOSPITAL!    Patients discharged on the day of surgery will not be allowed to drive home.  Someone NEEDS to stay with you for the first 24 hours after anesthesia.   Special Instructions: Bring a copy of your healthcare power of attorney and living will documents the day of surgery if you haven't scanned them before.              Please read over the following fact sheets you were given: IF YOU HAVE QUESTIONS ABOUT YOUR PRE-OP INSTRUCTIONS PLEASE CALL [phone]   If you received a COVID test during your pre-op visit  it is requested that you wear a mask when out in public, stay away from anyone that may not be feeling well and notify your surgeon if you develop symptoms. If you test positive for Covid or have been in contact with anyone that has tested positive in the last 10 days please notify you surgeon.    [facility] - Preparing for Surgery Before surgery, you can play an important role.  Because skin is not sterile, your skin needs to be as free of germs as possible.  You can reduce the number of germs on your skin by washing with CHG  (chlorahexidine gluconate) soap before surgery.  CHG is an antiseptic cleaner which kills germs and bonds with the skin to continue killing germs even after washing. Please DO NOT use if you have an allergy to CHG or antibacterial soaps.  If your skin becomes reddened/irritated stop using the CHG and inform your nurse when you arrive at Short Stay. Do not shave (including legs and underarms) for at least 48 hours prior to the first CHG shower.  You may shave your face/neck.  Please follow these instructions carefully:  1.  Shower with CHG Soap the night before surgery and the  morning of surgery.  2.  If you choose to wash your hair, wash your hair first as usual with your normal  shampoo.  3.  After you shampoo, rinse your hair and body thoroughly to remove the shampoo.                             4.  Use CHG as you would any other liquid soap.  You can apply chg directly to the skin and wash.  Gently with a scrungie or clean washcloth.  5.  Apply the CHG Soap to your body ONLY FROM THE NECK DOWN.   Do   not use on face/ open                           Wound or open sores. Avoid contact with eyes, ears mouth and  genitals (private parts).                       Wash face,  Genitals (private parts) with your normal soap.             6.  Wash thoroughly, paying special attention to the area where your    surgery  will be performed.  7.  Thoroughly rinse your body with warm water from the neck down.  8.  DO NOT shower/wash with your normal soap after using and rinsing off the CHG Soap.                9.  Pat yourself dry with a clean towel.            10.  Wear clean pajamas.            11.  Place clean sheets on your bed the night of your first shower and do not  sleep with pets. Day of Surgery : Do not apply any lotions/deodorants the morning of surgery.  Please wear clean clothes to the hospital/surgery center.  FAILURE TO FOLLOW THESE INSTRUCTIONS MAY RESULT IN THE CANCELLATION OF YOUR  SURGERY  PATIENT SIGNATURE_________________________________  NURSE SIGNATURE__________________________________  ________________________________________________________________________

## 2023-03-17 NOTE — Progress Notes (Addendum)
COVID Vaccine received:  []  No [x]  Yes Date of any COVID positive Test in last 90 days:April 2024  PCP - Einar Crow MD Cardiologist - No  Chest x-ray - No EKG -  02/15/22 EPIC Stress Test - No ECHO - No Cardiac Cath - No  Bowel Prep - [x]  No  []   Yes ______  Pacemaker / ICD device [x]  No []  Yes   Spinal Cord Stimulator:[x]  No []  Yes       History of Sleep Apnea? []  No [x]  Yes   CPAP used?- [x]  No []  Yes    Does the patient monitor blood sugar?          [x]  No []  Yes  []  N/A  Patient has: [x]  NO Hx DM   []  Pre-DM                 []  DM1  []   DM2 Does patient have a [employer] or Dexacom? [x]  No []  Yes   Fasting Blood Sugar Ranges- N/A Checks Blood Sugar _0____ times a day  GLP1 agonist / usual dose - No GLP1 instructions:  SGLT-2 inhibitors / usual dose - No SGLT-2 instructions:   Blood Thinner / Instructions:N/A Aspirin Instructions:N/A  Comments:   Activity level: Patient is able to climb a flight of stairs without difficulty; [x]  No CP  [x]  No SOB, but would have ___   Patient can  nperform ADLs without assistance.   Anesthesia review:   Patient denies shortness of breath, fever, cough and chest pain at PAT appointment.  Patient verbalized understanding and agreement to the Pre-Surgical Instructions that were given to them at this PAT appointment. Patient was also educated of the need to review these PAT instructions again prior to his/her surgery.I reviewed the appropriate phone numbers to call if they have any and questions or concerns.

## 2023-03-21 ENCOUNTER — Other Ambulatory Visit: Payer: Self-pay

## 2023-03-21 ENCOUNTER — Encounter (HOSPITAL_COMMUNITY)
Admission: RE | Admit: 2023-03-21 | Discharge: 2023-03-21 | Disposition: A | Discharge status: Home / Self Care | Payer: Medicare Other | Source: Ambulatory Visit | Attending: General Surgery | Admitting: General Surgery

## 2023-03-21 ENCOUNTER — Encounter (HOSPITAL_COMMUNITY): Payer: Self-pay

## 2023-03-21 VITALS — BP 147/89 | Temp 98.1°F | Resp 16 | Ht 70.0 in | Wt 192.6 lb

## 2023-03-21 DIAGNOSIS — I10 Essential (primary) hypertension: Secondary | ICD-10-CM | POA: Insufficient documentation

## 2023-03-21 DIAGNOSIS — Z01818 Encounter for other preprocedural examination: Secondary | ICD-10-CM | POA: Insufficient documentation

## 2023-03-21 HISTORY — DX: Sleep apnea, unspecified: G47.30

## 2023-03-21 HISTORY — DX: Anemia, unspecified: D64.9

## 2023-03-21 HISTORY — DX: Depression, unspecified: F32.A

## 2023-03-21 LAB — BASIC METABOLIC PANEL
Anion gap: 11 (ref 5–15)
BUN: 11 mg/dL (ref 8–23)
CO2: 25 mmol/L (ref 22–32)
Calcium: 8.9 mg/dL (ref 8.9–10.3)
Chloride: 98 mmol/L (ref 98–111)
Creatinine, Ser: 1.05 mg/dL (ref 0.61–1.24)
GFR, Estimated: >60 mL/min (ref >60)
Glucose, Bld: 120 mg/dL — ABNORMAL HIGH (ref 70–99)
Potassium: 4 mmol/L (ref 3.5–5.1)
Sodium: 134 mmol/L — ABNORMAL LOW (ref 135–145)

## 2023-03-21 LAB — CBC
HCT: 40.9 % (ref 39.0–52.0)
Hemoglobin: 13 g/dL (ref 13.0–17.0)
MCH: 28 pg (ref 26.0–34.0)
MCHC: 31.8 g/dL (ref 30.0–36.0)
MCV: 88 fL (ref 80.0–100.0)
Platelets: 156 10*3/uL (ref 150–400)
RBC: 4.65 MIL/uL (ref 4.22–5.81)
RDW: 15.2 % (ref 11.5–15.5)
WBC: 6 10*3/uL (ref 4.0–10.5)
nRBC: 0 % (ref 0.0–0.2)

## 2023-03-28 ENCOUNTER — Encounter: Payer: Self-pay | Admitting: Adult Health

## 2023-03-28 ENCOUNTER — Non-Acute Institutional Stay (INDEPENDENT_AMBULATORY_CARE_PROVIDER_SITE_OTHER): Payer: Medicare Other | Admitting: Adult Health

## 2023-03-28 VITALS — BP 150/88 | HR 83 | Temp 97.8°F | Resp 16 | Ht 70.0 in | Wt 197.2 lb

## 2023-03-28 DIAGNOSIS — Z0000 Encounter for general adult medical examination without abnormal findings: Secondary | ICD-10-CM | POA: Diagnosis not present

## 2023-03-28 NOTE — Progress Notes (Signed)
Subjective:   Kenneth Cunningham is a 77 y.o. male who presents for Medicare Annual/Subsequent preventive examination at [facility].   Review of Systems     Cardiac Risk Factors include: advanced age (>29men, >7 women);male gender     Objective:    Today's Vitals   03/28/23 1302 03/28/23 1306  BP: (!) 152/88 (!) 150/88  Pulse: 83   Resp: 16   Temp: 97.8 F (36.6 C)   TempSrc: Temporal   SpO2: 97%   Weight: 197 lb 3.2 oz (89.4 kg)   Height: 5\' 10"  (1.778 m)   PainSc:  0-No pain   Body mass index is 28.3 kg/m.     03/28/2023    1:05 PM 03/21/2023    1:18 PM 02/01/2023    1:44 PM 01/21/2023    9:59 AM 12/20/2022    1:27 PM 12/07/2022    9:58 AM 11/22/2022    1:05 PM  Advanced Directives  Does Patient Have a Medical Advance Directive? No Yes No No No No No  Type of Advance Directive  Healthcare Power of Attorney       Does patient want to make changes to medical advance directive? No - Patient declined No - Patient declined       Copy of Healthcare Power of Attorney in Chart?  No - copy requested       Would patient like information on creating a medical advance directive?   No - Patient declined No - Patient declined No - Patient declined No - Patient declined No - Patient declined    Current Medications (verified) Outpatient Encounter Medications as of 03/28/2023  Medication Sig   bisacodyl (DULCOLAX) 10 MG suppository Place 1 suppository (10 mg total) rectally as needed for moderate constipation.   Carboxymethylcellulose Sodium (ARTIFICIAL TEARS OP) Place 2 drops into both eyes 2 (two) times daily.   ferrous sulfate 325 (65 FE) MG EC tablet Take 325 mg by mouth every Monday, Wednesday, and Friday.   LORazepam (ATIVAN) 0.5 MG tablet Take 1 tablet (0.5 mg total) by mouth 3 (three) times daily as needed for anxiety.   lubiprostone (AMITIZA) 24 MCG capsule Take 24 mcg by mouth 2 (two) times daily as needed for constipation.   mirtazapine  (REMERON) 15 MG tablet Take 15 mg by mouth at bedtime.   pantoprazole (PROTONIX) 40 MG tablet Take 40 mg by mouth in the morning.   polyethylene glycol (MIRALAX / GLYCOLAX) 17 g packet Take 17 g by mouth 2 (two) times daily.   Probiotic Product (PROBIOTIC GUMMIES PO) Take 2 each by mouth in the morning.   risperiDONE (RISPERDAL) 3 MG tablet Take 1 tablet by mouth once daily (Patient taking differently: Take 3 mg by mouth at bedtime.)   sennosides-docusate sodium (SENOKOT-S) 8.6-50 MG tablet Take 2 tablets by mouth in the morning and at bedtime. Hold for loose stools   sertraline (ZOLOFT) 100 MG tablet Take 200 mg by mouth at bedtime.   simethicone (MYLICON) 125 MG chewable tablet Chew 125 mg by mouth 2 (two) times daily as needed (gas pain.).   tamsulosin (FLOMAX) 0.4 MG CAPS capsule Take 0.8 mg by mouth every evening.   No facility-administered encounter medications on file as of 03/28/2023.    Allergies (verified) Nsaids   History: Past Medical History:  Diagnosis Date   Anemia    Anxiety    Per [facility] new patient packet   Depression    High blood pressure  Per   High cholesterol    Per [facility] new patient packet   IBS (irritable bowel syndrome)    Per [facility] new patient packet   OCD (obsessive compulsive disorder)    Per [facility] new patient packet   Prostate cancer (HCC)    Per [facility] new patient packet   Sleep apnea    Past Surgical History:  Procedure Laterality Date   COLONOSCOPY     TONSILECTOMY, ADENOIDECTOMY, BILATERAL MYRINGOTOMY AND TUBES     Per [facility] new patient packet   Family History  Problem Relation Age of Onset   Dementia Mother    Social History   Socioeconomic History   Marital status: Widowed    Spouse name: Not on file   Number of children: Not on file   Years of education: Not on file   Highest education level: Not on file  Occupational History   Not on file  Tobacco Use   Smoking status: Never   Smokeless tobacco: Never  Vaping Use   Vaping Use: Never  used  Substance and Sexual Activity   Alcohol use: Never   Drug use: Never   Sexual activity: Not on file  Other Topics Concern   Not on file  Social History Narrative   Diet       Do you drink/eat things with caffeine  YES      Marital Status WIDOWED  What year were you married?  1975      Do you live in a house, apartment, assisted living, condo, trailer, etc.?  HOUSE      Is it one or more stories?  ONE STORY WITH A BASEMENT      How many persons live in your home? 1         Do you have any pets in your home?(please list)  NONE      Highest level of education completed: BACHELOR DEGREE IN BUSINESS AND VARIOUS PROFESSIONAL DEGREES      Current or past profession:  BANKER;VICE PRESIDENT AND TREASURER [employer]      Do you exercise?: NO    Type and how often:      Do you have a Living Will? (Form that indicates scenarios where you would not want your life prolonged)  YES      Do you have a DNR form?   NO      If not, would you like to discuss one? YES      Do you have signed POA/HPOA forms? NO      Do you have difficulty bathing or dressing yourself? TROUBLE BATHING SELF. DIFFICULTY STANDING IN SHOWER BATHES VERY INFREQUENTLY (ONCE EVERY 2 WEEKS)      Do you have difficulty preparing food or eating?  TROUBLE PREPARING MEALS NO TROUBLE EATING PATIENT IS VERY LIMITED WITH MEALS      Do you have difficulty managing medications?  NO      Do you have difficulty managing your finances?  NO      Do you have difficulty affording your medications?  NO                     Social Determinants of Corporate investment banker Strain: Not on file  Food Insecurity: Not on file  Transportation Needs: Not on file  Physical Activity: Not on file  Stress: Not on file  Social Connections: Not on file    Tobacco Counseling Counseling given: Not Answered   Clinical  Intake:  Pre-visit preparation completed: Yes  Pain : No/denies pain Pain Score: 0-No pain      BMI - recorded: 28.3 Nutritional Status: BMI 25 -29 Overweight Nutritional Risks: None Diabetes: No  How often do you need to have someone help you when you read instructions, pamphlets, or other written materials from your doctor or pharmacy?: 1 - Never What is the last grade level you completed in school?: College graduate  Diabetic?no  Interpreter Needed?: No      Activities of Daily Living    03/28/2023    1:32 PM 03/21/2023    1:26 PM  In your present state of health, do you have any difficulty performing the following activities:  Hearing? 0   Vision? 1   Difficulty concentrating or making decisions? 1   Walking or climbing stairs? 1   Dressing or bathing? 0   Doing errands, shopping? 1 1  Preparing Food and eating ? N   Using the Toilet? N   In the past six months, have you accidently leaked urine? Y   Do you have problems with loss of bowel control? N   Managing your Medications? Y   Managing your Finances? Y   Housekeeping or managing your Housekeeping? N     Patient Care Team: Mahlon Gammon, MD as PCP - General (Internal Medicine)  Indicate any recent Medical Services you may have received from other than [facility] providers in the past year (date may be approximate).     Assessment:   This is a routine wellness examination for [employer].  Hearing/Vision screen Hearing Screening - Comments:: Not in the last 12 months  Dietary issues and exercise activities discussed: Current Exercise Habits: The patient does not participate in regular exercise at present, Exercise limited by: neurologic condition(s)   Goals Addressed               This Visit's Progress     maintain (pt-stated)        He would like to be more independent and maintain his functional status.        Depression Screen    03/28/2023    1:16 PM 03/28/2023    1:04 PM 02/01/2023    1:43 PM 12/20/2022    1:26 PM 02/15/2022    6:49 AM  PHQ 2/9 Scores  PHQ - 2 Score 1 0 1 0 3  PHQ- 9 Score      14    Fall Risk    03/28/2023    1:04 PM 02/01/2023    1:43 PM 12/20/2022    1:26 PM 07/21/2022    8:05 AM 03/03/2022   11:14 AM  Fall Risk   Falls in the past year? 0 0 0  0  Number falls in past yr: 0 0 0 0 0  Injury with Fall? 0 0 0 0 0  Risk for fall due to : No Fall Risks No Fall Risks   No Fall Risks  Follow up Falls evaluation completed Falls evaluation completed Falls evaluation completed  Falls evaluation completed    FALL RISK PREVENTION PERTAINING TO THE HOME:  Any stairs in or around the home? No  If so, are there any without handrails? No  Home free of loose throw rugs in walkways, pet beds, electrical cords, etc? Yes  Adequate lighting in your home to reduce risk of falls? Yes   ASSISTIVE DEVICES UTILIZED TO PREVENT FALLS:  Life alert? No  Use of a cane, walker or w/c?  No  Grab bars in the bathroom? Yes  Shower chair or bench in shower? No  Elevated toilet seat or a handicapped toilet? Yes   TIMED UP AND GO:  Was the test performed? Yes .  Length of time to ambulate 10 feet: 11 sec.   Gait slow and steady without use of assistive device  Cognitive Function:    12/20/2022    1:28 PM 02/03/2022    9:20 AM  MMSE - Mini Mental State Exam  Orientation to time 5 5  Orientation to Place 5 5  Registration 3 3  Attention/ Calculation 5 5  Recall 1 3  Language- name 2 objects 2 2  Language- repeat 1 1  Language- follow 3 step command 3 3  Language- read & follow direction 1 1  Write a sentence 1 1  Copy design 1 0  Total score 28 29        Immunizations Immunization History  Administered Date(s) Administered   Influenza, High Dose Seasonal PF 07/16/2022   Influenza-Unspecified 07/12/2017, 07/26/2018, 09/18/2019, 07/16/2022   Moderna Sars-Covid-2 Vaccination 08/16/2022   Pneumococcal Conjugate PCV 7 08/06/2015   Pneumococcal Polysaccharide-23 08/13/2013   Pneumococcal-Unspecified 08/06/2015   Td 12/05/2008   Td (Adult) 12/05/2008   Tdap  11/24/2022    TDAP status: Up to date  Flu Vaccine status: Up to date  Pneumococcal vaccine status: Due, Education has been provided regarding the importance of this vaccine. Advised may receive this vaccine at local pharmacy or Health Dept. Aware to provide a copy of the vaccination record if obtained from local pharmacy or Health Dept. Verbalized acceptance and understanding.  Covid-19 vaccine status: Completed vaccines  Qualifies for Shingles Vaccine? Yes   Zostavax completed No   Shingrix Completed?: No.    Education has been provided regarding the importance of this vaccine. Patient has been advised to call insurance company to determine out of pocket expense if they have not yet received this vaccine. Advised may also receive vaccine at local pharmacy or Health Dept. Verbalized acceptance and understanding.  Screening Tests Health Maintenance  Topic Date Due   Hepatitis C Screening  Never done   COVID-19 Vaccine (2 - Moderna risk series) 09/13/2022   Zoster Vaccines- Shingrix (1 of 2) 06/28/2023 (Originally 06/19/1965)   INFLUENZA VACCINE  05/12/2023   Medicare Annual Wellness (AWV)  03/27/2024   Colonoscopy  03/09/2032   DTaP/Tdap/Td (4 - Td or Tdap) 11/24/2032   HPV VACCINES  Aged Out   Pneumonia Vaccine 61+ Years old  Discontinued    Health Maintenance  Health Maintenance Due  Topic Date Due   Hepatitis C Screening  Never done   COVID-19 Vaccine (2 - Moderna risk series) 09/13/2022    Colorectal cancer screening: Type of screening: Colonoscopy. Completed 2023. Repeat every 3 years  Lung Cancer Screening: (Low Dose CT Chest recommended if Age 60-80 years, 30 pack-year currently smoking OR have quit w/in 15years.) does not qualify.   Lung Cancer Screening Referral: na  Additional Screening:  Hepatitis C Screening: does not qualify; Completed declined  Vision Screening: Recommended annual ophthalmology exams for early detection of glaucoma and other disorders of the  eye. Is the patient up to date with their annual eye exam?  No  Who is the provider or what is the name of the office in which the patient attends annual eye exams? na If pt is not established with a provider, would they like to be referred to a provider to establish care? Yes .  Dental Screening: Recommended annual dental exams for proper oral hygiene  Community Resource Referral / Chronic Care Management: CRR required this visit?  No   CCM required this visit?  No      Plan:     I have personally reviewed and noted the following in the patient's chart:   Medical and social history Use of alcohol, tobacco or illicit drugs  Current medications and supplements including opioid prescriptions. Patient is not currently taking opioid prescriptions. Functional ability and status Nutritional status Physical activity Advanced directives List of other physicians Hospitalizations, surgeries, and ER visits in previous 12 months Vitals Screenings to include cognitive, depression, and falls Referrals and appointments  In addition, I have reviewed and discussed with patient certain preventive protocols, quality metrics, and best practice recommendations. A written personalized care plan for preventive services as well as general preventive health recommendations were provided to patient.     Fletcher Anon, NP   03/28/2023   Nurse Notes: na

## 2023-03-28 NOTE — Patient Instructions (Addendum)
Mr. Kenneth Cunningham , Thank you for taking time to come for your Medicare Wellness Visit. I appreciate your ongoing commitment to your health goals. Please review the following plan we discussed and let me know if I can assist you in the future.   Screening recommendations/referrals: Colonoscopy every 3 years 2026 Recommended yearly ophthalmology/optometry visit for glaucoma screening and checkup Recommended yearly dental visit for hygiene and checkup  Vaccinations: Influenza vaccine due annually in September/October Pneumococcal vaccine prevnar 20 ordered  Tdap vaccine up to date Shingles vaccine declined    Advanced directives: reviewed power of attorney needs living wil  Conditions/risks identified: na  Next appointment: 1 year for AWV, will see me next month   Preventive Care 25 Years and Older, Male Preventive care refers to lifestyle choices and visits with your health care provider that can promote health and wellness. What does preventive care include? A yearly physical exam. This is also called an annual well check. Dental exams once or twice a year. Routine eye exams. Ask your health care provider how often you should have your eyes checked. Personal lifestyle choices, including: Daily care of your teeth and gums. Regular physical activity. Eating a healthy diet. Avoiding tobacco and drug use. Limiting alcohol use. Practicing safe sex. Taking low doses of aspirin every day. Taking vitamin and mineral supplements as recommended by your health care provider. What happens during an annual well check? The services and screenings done by your health care provider during your annual well check will depend on your age, overall health, lifestyle risk factors, and family history of disease. Counseling  Your health care provider may ask you questions about your: Alcohol use. Tobacco use. Drug use. Emotional well-being. Home and relationship well-being. Sexual activity. Eating  habits. History of falls. Memory and ability to understand (cognition). Work and work Astronomer. Screening  You may have the following tests or measurements: Height, weight, and BMI. Blood pressure. Lipid and cholesterol levels. These may be checked every 5 years, or more frequently if you are over 25 years old. Skin check. Lung cancer screening. You may have this screening every year starting at age 5 if you have a 30-pack-year history of smoking and currently smoke or have quit within the past 15 years. Fecal occult blood test (FOBT) of the stool. You may have this test every year starting at age 45. Flexible sigmoidoscopy or colonoscopy. You may have a sigmoidoscopy every 5 years or a colonoscopy every 10 years starting at age 37. Prostate cancer screening. Recommendations will vary depending on your family history and other risks. Hepatitis C blood test. Hepatitis B blood test. Sexually transmitted disease (STD) testing. Diabetes screening. This is done by checking your blood sugar (glucose) after you have not eaten for a while (fasting). You may have this done every 1-3 years. Abdominal aortic aneurysm (AAA) screening. You may need this if you are a current or former smoker. Osteoporosis. You may be screened starting at age 94 if you are at high risk. Talk with your health care provider about your test results, treatment options, and if necessary, the need for more tests. Vaccines  Your health care provider may recommend certain vaccines, such as: Influenza vaccine. This is recommended every year. Tetanus, diphtheria, and acellular pertussis (Tdap, Td) vaccine. You may need a Td booster every 10 years. Zoster vaccine. You may need this after age 5. Pneumococcal 13-valent conjugate (PCV13) vaccine. One dose is recommended after age 68. Pneumococcal polysaccharide (PPSV23) vaccine. One dose is recommended after  age 64. Talk to your health care provider about which screenings and  vaccines you need and how often you need them. This information is not intended to replace advice given to you by your health care provider. Make sure you discuss any questions you have with your health care provider. Document Released: 10/24/2015 Document Revised: 06/16/2016 Document Reviewed: 07/29/2015 Elsevier Interactive Patient Education  2017 [place] Prevention in the Home Falls can cause injuries. They can happen to people of all ages. There are many things you can do to make your home safe and to help prevent falls. What can I do on the outside of my home? Regularly fix the edges of walkways and driveways and fix any cracks. Remove anything that might make you trip as you walk through a door, such as a raised step or threshold. Trim any bushes or trees on the path to your home. Use bright outdoor lighting. Clear any walking paths of anything that might make someone trip, such as rocks or tools. Regularly check to see if handrails are loose or broken. Make sure that both sides of any steps have handrails. Any raised decks and porches should have guardrails on the edges. Have any leaves, snow, or ice cleared regularly. Use sand or salt on walking paths during winter. Clean up any spills in your garage right away. This includes oil or grease spills. What can I do in the bathroom? Use night lights. Install grab bars by the toilet and in the tub and shower. Do not use towel bars as grab bars. Use non-skid mats or decals in the tub or shower. If you need to sit down in the shower, use a plastic, non-slip stool. Keep the floor dry. Clean up any water that spills on the floor as soon as it happens. Remove soap buildup in the tub or shower regularly. Attach bath mats securely with double-sided non-slip rug tape. Do not have throw rugs and other things on the floor that can make you trip. What can I do in the bedroom? Use night lights. Make sure that you have a light by your  bed that is easy to reach. Do not use any sheets or blankets that are too big for your bed. They should not hang down onto the floor. Have a firm chair that has side arms. You can use this for support while you get dressed. Do not have throw rugs and other things on the floor that can make you trip. What can I do in the kitchen? Clean up any spills right away. Avoid walking on wet floors. Keep items that you use a lot in easy-to-reach places. If you need to reach something above you, use a strong step stool that has a grab bar. Keep electrical cords out of the way. Do not use floor polish or wax that makes floors slippery. If you must use wax, use non-skid floor wax. Do not have throw rugs and other things on the floor that can make you trip. What can I do with my stairs? Do not leave any items on the stairs. Make sure that there are handrails on both sides of the stairs and use them. Fix handrails that are broken or loose. Make sure that handrails are as long as the stairways. Check any carpeting to make sure that it is firmly attached to the stairs. Fix any carpet that is loose or worn. Avoid having throw rugs at the top or bottom of the stairs. If you do  have throw rugs, attach them to the floor with carpet tape. Make sure that you have a light switch at the top of the stairs and the bottom of the stairs. If you do not have them, ask someone to add them for you. What else can I do to help prevent falls? Wear shoes that: Do not have high heels. Have rubber bottoms. Are comfortable and fit you well. Are closed at the toe. Do not wear sandals. If you use a stepladder: Make sure that it is fully opened. Do not climb a closed stepladder. Make sure that both sides of the stepladder are locked into place. Ask someone to hold it for you, if possible. Clearly mark and make sure that you can see: Any grab bars or handrails. First and last steps. Where the edge of each step is. Use tools that  help you move around (mobility aids) if they are needed. These include: Canes. Walkers. Scooters. Crutches. Turn on the lights when you go into a dark area. Replace any light bulbs as soon as they burn out. Set up your furniture so you have a clear path. Avoid moving your furniture around. If any of your floors are uneven, fix them. If there are any pets around you, be aware of where they are. Review your medicines with your doctor. Some medicines can make you feel dizzy. This can increase your chance of falling. Ask your doctor what other things that you can do to help prevent falls. This information is not intended to replace advice given to you by your health care provider. Make sure you discuss any questions you have with your health care provider. Document Released: 07/24/2009 Document Revised: 03/04/2016 Document Reviewed: 11/01/2014 Elsevier Interactive Patient Education  2017 [employer].

## 2023-04-01 ENCOUNTER — Ambulatory Visit (HOSPITAL_BASED_OUTPATIENT_CLINIC_OR_DEPARTMENT_OTHER): Payer: Medicare Other | Admitting: Anesthesiology

## 2023-04-01 ENCOUNTER — Encounter (HOSPITAL_COMMUNITY): Payer: Self-pay | Admitting: General Surgery

## 2023-04-01 ENCOUNTER — Ambulatory Visit (HOSPITAL_COMMUNITY): Payer: Medicare Other | Admitting: Anesthesiology

## 2023-04-01 ENCOUNTER — Other Ambulatory Visit: Payer: Self-pay

## 2023-04-01 ENCOUNTER — Ambulatory Visit (HOSPITAL_COMMUNITY)
Admission: RE | Admit: 2023-04-01 | Discharge: 2023-04-01 | Disposition: A | Discharge status: Home / Self Care | Payer: Medicare Other | Source: Ambulatory Visit | Attending: General Surgery | Admitting: General Surgery

## 2023-04-01 ENCOUNTER — Encounter (HOSPITAL_COMMUNITY)
Admission: RE | Disposition: A | Discharge status: Home / Self Care | Payer: Self-pay | Source: Ambulatory Visit | Attending: General Surgery

## 2023-04-01 DIAGNOSIS — K4030 Unilateral inguinal hernia, with obstruction, without gangrene, not specified as recurrent: Secondary | ICD-10-CM | POA: Insufficient documentation

## 2023-04-01 DIAGNOSIS — I10 Essential (primary) hypertension: Secondary | ICD-10-CM | POA: Insufficient documentation

## 2023-04-01 DIAGNOSIS — G4730 Sleep apnea, unspecified: Secondary | ICD-10-CM | POA: Insufficient documentation

## 2023-04-01 DIAGNOSIS — F418 Other specified anxiety disorders: Secondary | ICD-10-CM

## 2023-04-01 DIAGNOSIS — K4000 Bilateral inguinal hernia, with obstruction, without gangrene, not specified as recurrent: Secondary | ICD-10-CM | POA: Diagnosis not present

## 2023-04-01 DIAGNOSIS — K4090 Unilateral inguinal hernia, without obstruction or gangrene, not specified as recurrent: Secondary | ICD-10-CM | POA: Diagnosis not present

## 2023-04-01 HISTORY — PX: XI ROBOTIC ASSISTED INGUINAL HERNIA REPAIR WITH MESH: SHX6706

## 2023-04-01 SURGERY — REPAIR, HERNIA, INGUINAL, ROBOT-ASSISTED, LAPAROSCOPIC, USING MESH
Anesthesia: General | Site: Abdomen | Laterality: Bilateral

## 2023-04-01 MED ORDER — ACETAMINOPHEN 500 MG PO TABS: 1000.0000 mg | ORAL_TABLET | Freq: Once | ORAL | Status: DC

## 2023-04-01 MED ORDER — ACETAMINOPHEN 500 MG PO TABS: 1000.0000 mg | ORAL_TABLET | Freq: Three times a day (TID) | ORAL | 0 refills | Status: AC

## 2023-04-01 MED ORDER — BUPIVACAINE-EPINEPHRINE 0.25% -1:200000 IJ SOLN
INTRAMUSCULAR | Status: AC
  Filled 2023-04-01: qty 1

## 2023-04-01 MED ORDER — PHENYLEPHRINE 80 MCG/ML (10ML) SYRINGE FOR IV PUSH (FOR BLOOD PRESSURE SUPPORT)
PREFILLED_SYRINGE | INTRAVENOUS | Status: AC
  Filled 2023-04-01: qty 10

## 2023-04-01 MED ORDER — PROPOFOL 10 MG/ML IV BOLUS
INTRAVENOUS | Status: DC | PRN
  Administered 2023-04-01: 150 mg via INTRAVENOUS
  Administered 2023-04-01: 50 mg via INTRAVENOUS

## 2023-04-01 MED ORDER — FENTANYL CITRATE (PF) 250 MCG/5ML IJ SOLN
INTRAMUSCULAR | Status: AC
  Filled 2023-04-01: qty 5

## 2023-04-01 MED ORDER — ROCURONIUM BROMIDE 10 MG/ML (PF) SYRINGE
PREFILLED_SYRINGE | INTRAVENOUS | Status: AC
  Filled 2023-04-01: qty 20

## 2023-04-01 MED ORDER — ONDANSETRON HCL 4 MG/2ML IJ SOLN
INTRAMUSCULAR | Status: DC | PRN
  Administered 2023-04-01: 4 mg via INTRAVENOUS

## 2023-04-01 MED ORDER — TRAMADOL HCL 50 MG PO TABS: 50.0000 mg | ORAL_TABLET | Freq: Four times a day (QID) | ORAL | 0 refills | Status: AC | PRN

## 2023-04-01 MED ORDER — ACETAMINOPHEN 500 MG PO TABS
1000.0000 mg | ORAL_TABLET | ORAL | Status: AC
  Administered 2023-04-01: 1000 mg via ORAL
  Filled 2023-04-01: qty 2

## 2023-04-01 MED ORDER — ORAL CARE MOUTH RINSE: 15.0000 mL | Freq: Once | OROMUCOSAL | Status: AC

## 2023-04-01 MED ORDER — ONDANSETRON HCL 4 MG/2ML IJ SOLN: 4.0000 mg | Freq: Once | INTRAMUSCULAR | Status: DC | PRN

## 2023-04-01 MED ORDER — CHLORHEXIDINE GLUCONATE CLOTH 2 % EX PADS: 6.0000 | MEDICATED_PAD | Freq: Once | CUTANEOUS | Status: DC

## 2023-04-01 MED ORDER — ROCURONIUM BROMIDE 100 MG/10ML IV SOLN
INTRAVENOUS | Status: DC | PRN
  Administered 2023-04-01: 20 mg via INTRAVENOUS
  Administered 2023-04-01: 80 mg via INTRAVENOUS
  Administered 2023-04-01 (×2): 20 mg via INTRAVENOUS

## 2023-04-01 MED ORDER — PROPOFOL 10 MG/ML IV BOLUS
INTRAVENOUS | Status: AC
  Filled 2023-04-01: qty 20

## 2023-04-01 MED ORDER — HYDROMORPHONE HCL 1 MG/ML IJ SOLN: 0.2500 mg | INTRAMUSCULAR | Status: DC | PRN

## 2023-04-01 MED ORDER — BUPIVACAINE LIPOSOME 1.3 % IJ SUSP
INTRAMUSCULAR | Status: AC
  Filled 2023-04-01: qty 20

## 2023-04-01 MED ORDER — CHLORHEXIDINE GLUCONATE 0.12 % MT SOLN
15.0000 mL | Freq: Once | OROMUCOSAL | Status: AC
  Administered 2023-04-01: 15 mL via OROMUCOSAL

## 2023-04-01 MED ORDER — AMISULPRIDE (ANTIEMETIC) 5 MG/2ML IV SOLN: 10.0000 mg | Freq: Once | INTRAVENOUS | Status: DC | PRN

## 2023-04-01 MED ORDER — CEFAZOLIN SODIUM-DEXTROSE 2-4 GM/100ML-% IV SOLN
2.0000 g | INTRAVENOUS | Status: AC
  Administered 2023-04-01: 2 g via INTRAVENOUS
  Filled 2023-04-01: qty 100

## 2023-04-01 MED ORDER — DEXAMETHASONE SODIUM PHOSPHATE 10 MG/ML IJ SOLN
INTRAMUSCULAR | Status: DC | PRN
  Administered 2023-04-01: 4 mg via INTRAVENOUS

## 2023-04-01 MED ORDER — ONDANSETRON HCL 4 MG/2ML IJ SOLN
INTRAMUSCULAR | Status: AC
  Filled 2023-04-01: qty 2

## 2023-04-01 MED ORDER — LACTATED RINGERS IV SOLN: INTRAVENOUS | Status: DC

## 2023-04-01 MED ORDER — OXYCODONE HCL 5 MG/5ML PO SOLN: 5.0000 mg | Freq: Once | ORAL | Status: DC | PRN

## 2023-04-01 MED ORDER — LACTATED RINGERS IV SOLN: INTRAVENOUS | Status: DC | PRN

## 2023-04-01 MED ORDER — FENTANYL CITRATE (PF) 100 MCG/2ML IJ SOLN
INTRAMUSCULAR | Status: DC | PRN
  Administered 2023-04-01: 100 ug via INTRAVENOUS
  Administered 2023-04-01: 50 ug via INTRAVENOUS

## 2023-04-01 MED ORDER — TRAMADOL HCL 50 MG PO TABS: 50.0000 mg | ORAL_TABLET | Freq: Four times a day (QID) | ORAL | 0 refills | Status: DC | PRN

## 2023-04-01 MED ORDER — PHENYLEPHRINE HCL (PRESSORS) 10 MG/ML IV SOLN
INTRAVENOUS | Status: AC
  Filled 2023-04-01: qty 1

## 2023-04-01 MED ORDER — LIDOCAINE HCL (CARDIAC) PF 100 MG/5ML IV SOSY
PREFILLED_SYRINGE | INTRAVENOUS | Status: DC | PRN
  Administered 2023-04-01: 60 mg via INTRAVENOUS

## 2023-04-01 MED ORDER — BUPIVACAINE-EPINEPHRINE (PF) 0.25% -1:200000 IJ SOLN
INTRAMUSCULAR | Status: DC | PRN
  Administered 2023-04-01: 50 mL

## 2023-04-01 MED ORDER — PHENYLEPHRINE HCL-NACL 20-0.9 MG/250ML-% IV SOLN
INTRAVENOUS | Status: DC | PRN
  Administered 2023-04-01: 50 ug/min via INTRAVENOUS

## 2023-04-01 MED ORDER — SUGAMMADEX SODIUM 500 MG/5ML IV SOLN
INTRAVENOUS | Status: DC | PRN
  Administered 2023-04-01: 400 mg via INTRAVENOUS

## 2023-04-01 MED ORDER — ROCURONIUM BROMIDE 10 MG/ML (PF) SYRINGE
PREFILLED_SYRINGE | INTRAVENOUS | Status: AC
  Filled 2023-04-01: qty 10

## 2023-04-01 MED ORDER — LIDOCAINE HCL (PF) 2 % IJ SOLN
INTRAMUSCULAR | Status: AC
  Filled 2023-04-01: qty 5

## 2023-04-01 MED ORDER — OXYCODONE HCL 5 MG PO TABS: 5.0000 mg | ORAL_TABLET | Freq: Once | ORAL | Status: DC | PRN

## 2023-04-01 MED ORDER — DEXAMETHASONE SODIUM PHOSPHATE 10 MG/ML IJ SOLN
INTRAMUSCULAR | Status: AC
  Filled 2023-04-01: qty 1

## 2023-04-01 MED ORDER — 0.9 % SODIUM CHLORIDE (POUR BTL) OPTIME
TOPICAL | Status: DC | PRN
  Administered 2023-04-01: 1000 mL

## 2023-04-01 SURGICAL SUPPLY — 62 items
ANTIFOG SOL W/FOAM PAD STRL (MISCELLANEOUS) ×1
APL PRP STRL LF DISP 70% ISPRP (MISCELLANEOUS) ×1
BAG COUNTER SPONGE SURGICOUNT (BAG) IMPLANT
BAG SPNG CNTER NS LX DISP (BAG)
BLADE SURG SZ11 CARB STEEL (BLADE) ×1 IMPLANT
CANNULA REDUCER 12-8 DVNC XI (CANNULA) IMPLANT
CHLORAPREP W/TINT 26 (MISCELLANEOUS) ×1 IMPLANT
COVER MAYO STAND STRL (DRAPES) ×1 IMPLANT
COVER SURGICAL LIGHT HANDLE (MISCELLANEOUS) ×1 IMPLANT
COVER TIP SHEARS 8 DVNC (MISCELLANEOUS) ×1 IMPLANT
DEFOGGER SCOPE WARMER CLEARIFY (MISCELLANEOUS) IMPLANT
DRAPE ARM DVNC X/XI (DISPOSABLE) ×3 IMPLANT
DRAPE COLUMN DVNC XI (DISPOSABLE) ×1 IMPLANT
DRIVER NDL LRG 8 DVNC XI (INSTRUMENTS) ×1 IMPLANT
DRIVER NDL MEGA SUTCUT DVNCXI (INSTRUMENTS) ×2 IMPLANT
DRIVER NDLE LRG 8 DVNC XI (INSTRUMENTS) IMPLANT
DRIVER NDLE MEGA SUTCUT DVNCXI (INSTRUMENTS) ×1 IMPLANT
DRSG TEGADERM 2-3/8X2-3/4 SM (GAUZE/BANDAGES/DRESSINGS) ×3 IMPLANT
ELECT REM PT RETURN 15FT ADLT (MISCELLANEOUS) ×1 IMPLANT
FORCEPS BPLR FENES DVNC XI (FORCEP) IMPLANT
GAUZE SPONGE 2X2 8PLY STRL LF (GAUZE/BANDAGES/DRESSINGS) ×1 IMPLANT
GLOVE BIO SURGEON STRL SZ7.5 (GLOVE) ×2 IMPLANT
GLOVE INDICATOR 8.0 STRL GRN (GLOVE) ×2 IMPLANT
GOWN STRL REUS W/ TWL XL LVL3 (GOWN DISPOSABLE) ×2 IMPLANT
GOWN STRL REUS W/TWL XL LVL3 (GOWN DISPOSABLE) ×2
GRASPER SUT TROCAR 14GX15 (MISCELLANEOUS) IMPLANT
GRASPER TIP-UP FEN DVNC XI (INSTRUMENTS) ×1 IMPLANT
IRRIG SUCT STRYKERFLOW 2 WTIP (MISCELLANEOUS)
IRRIGATION SUCT STRKRFLW 2 WTP (MISCELLANEOUS) IMPLANT
KIT BASIN OR (CUSTOM PROCEDURE TRAY) ×1 IMPLANT
KIT TURNOVER KIT A (KITS) IMPLANT
MARKER SKIN DUAL TIP RULER LAB (MISCELLANEOUS) ×1 IMPLANT
MESH 3DMAX MID 4X6 LT LRG (Mesh General) IMPLANT
MESH 3DMAX MID 4X6 RT LRG (Mesh General) IMPLANT
NDL HYPO 22X1.5 SAFETY MO (MISCELLANEOUS) ×1 IMPLANT
NEEDLE HYPO 22X1.5 SAFETY MO (MISCELLANEOUS) ×1 IMPLANT
OBTURATOR OPTICAL STND 8 DVNC (TROCAR) ×1
OBTURATOR OPTICALSTD 8 DVNC (TROCAR) ×1 IMPLANT
PACK CARDIOVASCULAR III (CUSTOM PROCEDURE TRAY) ×1 IMPLANT
PAD POSITIONING PINK XL (MISCELLANEOUS) ×1 IMPLANT
SCISSORS LAP 5X35 DISP (ENDOMECHANICALS) IMPLANT
SCISSORS MNPLR CVD DVNC XI (INSTRUMENTS) ×1 IMPLANT
SEAL UNIV 5-12 XI (MISCELLANEOUS) ×3 IMPLANT
SOL ELECTROSURG ANTI STICK (MISCELLANEOUS) ×1
SOLUTION ANTFG W/FOAM PAD STRL (MISCELLANEOUS) ×1 IMPLANT
SOLUTION ELECTROSURG ANTI STCK (MISCELLANEOUS) ×1 IMPLANT
SPIKE FLUID TRANSFER (MISCELLANEOUS) ×1 IMPLANT
STRIP CLOSURE SKIN 1/2X4 (GAUZE/BANDAGES/DRESSINGS) ×1 IMPLANT
SUT MNCRL AB 4-0 PS2 18 (SUTURE) ×1 IMPLANT
SUT SILK 3 0 SH 30 (SUTURE) IMPLANT
SUT VIC AB 2-0 SH 27 (SUTURE)
SUT VIC AB 2-0 SH 27X BRD (SUTURE) IMPLANT
SUT VIC AB 3-0 SH 27 (SUTURE) ×5
SUT VIC AB 3-0 SH 27XBRD (SUTURE) ×2 IMPLANT
SUT VICRYL 0 UR6 27IN ABS (SUTURE) IMPLANT
SUT VLOC 180 3-0 9IN GS21 (SUTURE) ×1 IMPLANT
SYR 20ML LL LF (SYRINGE) ×1 IMPLANT
TAPE STRIPS DRAPE STRL (GAUZE/BANDAGES/DRESSINGS) ×1 IMPLANT
TOWEL OR 17X26 10 PK STRL BLUE (TOWEL DISPOSABLE) ×1 IMPLANT
TOWEL OR NON WOVEN STRL DISP B (DISPOSABLE) ×1 IMPLANT
TROCAR XCEL NON-BLD 5MMX100MML (ENDOMECHANICALS) ×1 IMPLANT
TUBING INSUFFLATION 10FT LAP (TUBING) ×1 IMPLANT

## 2023-04-01 NOTE — Transfer of Care (Signed)
Immediate Anesthesia Transfer of Care Note  Patient: Kenneth Cunningham  Procedure(s) Performed: XI ROBOTIC REPAIR RIGHT INGUINAL HERNIA REPAIR WITH MESH, XI ROOBOTIC REPAIR INCARCERATED LEFT INGUINAL HERNIA WITH MESH, LAPAROSCOPIC TAP BLOCK (Bilateral: Abdomen)  Patient Location: PACU  Anesthesia Type:General  Level of Consciousness: awake, drowsy, and patient cooperative  Airway & Oxygen Therapy: Patient Spontanous Breathing and Patient connected to face mask oxygen  Post-op Assessment: Report given to RN, Post -op Vital signs reviewed and stable, and Patient moving all extremities X 4  Post vital signs: Reviewed and stable  Last Vitals:  Vitals Value Taken Time  BP 146/84 04/01/23 1510  Temp    Pulse 85 04/01/23 1514  Resp 17 04/01/23 1514  SpO2 99 % 04/01/23 1514  Vitals shown include unvalidated device data.  Last Pain:  Vitals:   04/01/23 0917  TempSrc: Oral         Complications: No notable events documented.

## 2023-04-01 NOTE — H&P (Signed)
CC: here for surgery  Requesting provider: n/a  HPI: Kenneth Cunningham is an 77 y.o. male who is here for robotic repair of bilateral inguinal hernias. I saw the other year but pt saw my partner in clinic about a month ago.  Patient had not scheduled his hernia repairs with me last year because he was in middle of transitioning to [facility].  My partner does not perform minimally invasive inguinal hernia repairs so he contacted me and the patient wanted to proceed with minimally invasive bilateral inguinal hernia repairs.  He denies any medical changes since he saw Dr. Carolynne Edouard in the office last month.  I also talked with him on the phone the day after his appointment with Dr. Carolynne Edouard. Family member at bedside.    Past Medical History:  Diagnosis Date   Anemia    Anxiety    Per [facility] new patient packet   Depression    High blood pressure    Per   High cholesterol    Per [facility] new patient packet   IBS (irritable bowel syndrome)    Per [facility] new patient packet   OCD (obsessive compulsive disorder)    Per [facility] new patient packet   Prostate cancer (HCC)    Per [facility] new patient packet   Sleep apnea     Past Surgical History:  Procedure Laterality Date   COLONOSCOPY     TONSILECTOMY, ADENOIDECTOMY, BILATERAL MYRINGOTOMY AND TUBES     Per [facility] new patient packet    Family History  Problem Relation Age of Onset   Dementia Mother     Social:  reports that he has never smoked. He has never used smokeless tobacco. He reports that he does not drink alcohol and does not use drugs.  Allergies:  Allergies  Allergen Reactions   Nsaids Other (See Comments)    Kidney disease per Patient    Medications: I have reviewed the patient's current medications.   ROS - all of the below systems have been reviewed with the patient and positives are indicated with bold text General: chills, fever or night sweats Eyes: blurry vision or double vision ENT: epistaxis or sore throat Allergy/Immunology: itchy/watery  eyes or nasal congestion Hematologic/Lymphatic: bleeding problems, blood clots or swollen lymph nodes Endocrine: temperature intolerance or unexpected weight changes Breast: new or changing breast lumps or nipple discharge Resp: cough, shortness of breath, or wheezing CV: chest pain or dyspnea on exertion GI: as per HPI GU: dysuria, trouble voiding, or hematuria MSK: joint pain or joint stiffness Neuro: TIA or stroke symptoms Derm: pruritus and skin lesion changes Psych: anxiety and depression  PE Blood pressure 134/86, pulse 100, temperature 98 F (36.7 C), temperature source Oral, resp. rate 16, SpO2 98 %. Constitutional: NAD; conversant; no deformities Eyes: Moist conjunctiva; no lid lag; anicteric; PERRL Neck: Trachea midline; no thyromegaly Lungs: Normal respiratory effort; no tactile fremitus CV: RRR; no palpable thrills; no pitting edema GI: Abd soft, nt, nd; no palpable hepatosplenomegaly; bilateral inguinal hernias MSK: Normal gait; no clubbing/cyanosis Psychiatric: Appropriate affect; alert and oriented x3 Lymphatic: No palpable cervical or axillary lymphadenopathy Skin:no rash/lesions  No results found for this or any previous visit (from the past 48 hour(s)).  No results found.  Imaging: none  A/P: Kenneth Cunningham is an 77 y.o. male with bilateral inguinal hernia To OR for robotic repair of bilateral inguinal hernias with mesh ERAS IV abx Discussed typical recovery All questions asked and answered  Mary Sella. Andrey Campanile, MD, FACS General, Bariatric, &  Minimally Invasive Surgery [facility]

## 2023-04-01 NOTE — Brief Op Note (Signed)
04/01/2023  2:56 PM  PATIENT:  Kenneth Cunningham  77 y.o. male  PRE-OPERATIVE DIAGNOSIS:  bilateral inguinal hernia  POST-OPERATIVE DIAGNOSIS:  incarcerated left indirect inguinal;  right direct inguinal hernia  PROCEDURE:  Procedure(s): XI ROBOTIC REPAIR RIGHT INGUINAL HERNIA REPAIR WITH MESH, XI ROOBOTIC REPAIR INCARCERATED LEFT INGUINAL HERNIA WITH MESH, LAPAROSCOPIC TAP BLOCK (Bilateral)  SURGEON:  Surgeon(s) and Role:    Gaynelle Adu, MD - Primary  PHYSICIAN ASSISTANT:   ASSISTANTS: none   ANESTHESIA:   general  EBL:  20 mL   BLOOD ADMINISTERED:none  DRAINS: none   LOCAL MEDICATIONS USED:  MARCAINE    and OTHER exparel  SPECIMEN:  No Specimen  DISPOSITION OF SPECIMEN:  N/A  COUNTS:  YES  TOURNIQUET:  * No tourniquets in log *  DICTATION: .Dragon Dictation  PLAN OF CARE: Discharge to home after PACU  PATIENT DISPOSITION:  PACU - hemodynamically stable.   Delay start of Pharmacological VTE agent (>24hrs) due to surgical blood loss or risk of bleeding: not applicable  Mary Sella. Andrey Campanile, MD, FACS General, Bariatric, & Minimally Invasive Surgery [facility],  [facility]

## 2023-04-01 NOTE — Anesthesia Procedure Notes (Signed)
Procedure Name: Intubation Date/Time: 04/01/2023 11:19 AM  Performed by: Shanon Payor, CRNAPre-anesthesia Checklist: Patient identified, Emergency Drugs available, Suction available, Patient being monitored and Timeout performed Patient Re-evaluated:Patient Re-evaluated prior to induction Oxygen Delivery Method: Circle system utilized Preoxygenation: Pre-oxygenation with 100% oxygen Induction Type: IV induction Ventilation: Mask ventilation without difficulty Laryngoscope Size: Mac and 4 Grade View: Grade I Tube type: Oral Number of attempts: 1 Airway Equipment and Method: Stylet Placement Confirmation: ETT inserted through vocal cords under direct vision, positive ETCO2, CO2 detector and breath sounds checked- equal and bilateral Secured at: 23 cm Tube secured with: Tape Dental Injury: Teeth and Oropharynx as per pre-operative assessment

## 2023-04-01 NOTE — Anesthesia Postprocedure Evaluation (Signed)
Anesthesia Post Note  Patient: Kenneth Cunningham  Procedure(s) Performed: XI ROBOTIC REPAIR RIGHT INGUINAL HERNIA REPAIR WITH MESH, XI ROOBOTIC REPAIR INCARCERATED LEFT INGUINAL HERNIA WITH MESH, LAPAROSCOPIC TAP BLOCK (Bilateral: Abdomen)     Patient location during evaluation: PACU Anesthesia Type: General Level of consciousness: awake and alert, oriented and patient cooperative Pain management: pain level controlled Vital Signs Assessment: post-procedure vital signs reviewed and stable Respiratory status: spontaneous breathing, nonlabored ventilation and respiratory function stable Cardiovascular status: blood pressure returned to baseline and stable Postop Assessment: no apparent nausea or vomiting Anesthetic complications: no   No notable events documented.  Last Vitals:  Vitals:   04/01/23 1515 04/01/23 1530  BP: (!) 144/86 (!) 152/86  Pulse: 82 88  Resp: 13 12  Temp:    SpO2: 100% 100%    Last Pain:  Vitals:   04/01/23 1530  TempSrc:   PainSc: Asleep                 Lannie Fields

## 2023-04-01 NOTE — Op Note (Signed)
PREOPERATIVE DIAGNOSIS: bilateral inguinal hernias   POSTOPERATIVE DIAGNOSIS: Left indirect incarcerated inguinal hernia; right direct inguinal hernia   PROCEDURE: Robotic/XI repair of left indirect incarcerated inguinal hernia with  mesh & robotic repair of right direct inguinal hernia with mesh (rTAPP).  Laparoscopic bilateral TAP block   SURGEON: Mary Sella. Andrey Campanile, MD    ASSISTANT SURGEON: None.    ANESTHESIA: General plus local consisting of 0.25% Marcaine with epi mixed with exparel.    ESTIMATED BLOOD LOSS: Minimal.    FINDINGS: The patient had a incarcerated left indirect inguinal hernia.  It was containing sigmoid colon.  On the right side he had a right direct inguinal hernia.  Both were repaired using Bard 3d midweight mesh    SPECIMEN: None   INDICATIONS FOR PROCEDURE: 77yo presented for repair of a symptomatic inguinal hernias. The risks and benefits including but not limited to bleeding, infection, chronic inguinal pain, nerve entrapment, hernia recurrence, mesh complications, hematoma formation, urinary retention, injury to the testicles or the ovaries, numbness in the groin, blood clots, injury to the surrounding structures, and anesthesia risk was discussed with the patient.   DESCRIPTION OF PROCEDURE: After obtaining verbal consent the patient was then taken back to the operating room, placed  supine on the operating room table. General endotracheal anesthesia was  established. Foley was placed. Sequential compression devices were placed. The  abdomen and groin were prepped and draped in the usual standard surgical  fashion with ChloraPrep. The patient received oral Tylenol preoperatively as well as IV  antibiotics prior to the incision. A surgical time-out was performed.  Local was infiltrated at the base of the umbilicus.     Optical entry was made using the Optiview technique in the left midclavicular line about 8 cm lateral to the umbilicus a few centimeters below  the left subcostal margin.  Using a 0 degree 5 mm laparoscope through a 5 mm trocar I was able to advance the laparoscope carefully through all layers of the abdominal wall and carefully entered the abdominal cavity.  Pneumoperitoneum was smoothly established up to a patient pressure of 15 mmHg without any change in patient vital signs.  There is no evidence of injury to surrounding structures.  A bilateral laparoscopic tap block was performed for postoperative pain relief  The inguinal areas were inspected and there was evidence of bilateral inguinal hernias.  On the left side he had an indirect hernia which appeared to have an epiploic appendage going down the inguinal canal from the sigmoid colon.  On the right side he had a direct hernia..  Patient was placed in Trendelenburg position.  A robotic 8 mm trocar was placed in the supraumbilical position about 18 cm from the pubic bone.  An additional 8 mm robotic trocar was placed in the right lateral abdominal wall.  The optical entry trocar was exchanged for an 8 mm robotic trocar.   A large piece of right and left Bard 3D midweight  mesh were placed through the robotic trocar into the abdominal cavity.  The left sided mesh had been marked with an L.  I went ahead and placed four  3-0 Vicryl sutures  on SH into the abdomen off to the side.  I then placed two 3-0 absorbable V-Loc sutures through the trocar into the abdomen off to the side.    We then deployed the robot for pelvic surgery. The robot was docked.  Robotic laparoscope was placed through the supraumbilical trocar and the anatomy was targeted.  The other arms were then connected to the trochars.  A pair of MCS scissors was placed through the right trocar and a fenestrated bipolar through the left trocar all under direct visualization.  I then scrubbed out and went to the robotic console.    I then made incision along the peritoneum on the right, starting 2 inches above the anterior superior  iliac spine and caring it medial toward the median umbilical ligament in a lazy S configuration using MCS scissors with electrocautery. The peritoneal flap was then gently dissected downward from the anterior abdominal wall taking care not to  injure the inferior epigastric vessels.  However the peritoneal flap from the anterior abdominal wall was a little bit difficult to get into the correct space.  The peritoneum was densely fused to the somewhat deeper layer covering the fat layer on the abdominal wall.  At times the fat was stripped from the muscle.  I was ultimately able to get into the avascular preperitoneal space.  The pubic bone was identified.  Dissection continued about 2 cm below the level of the pubic bone.  There is no evidence of obturator or femoral canal hernias.  The testicular vessels were identified.  Using traction and counter traction, I reduced the sac in  its entirety. The testicular vessels had been identified and preserved. The vas deferens was identified and preserved, and the hernia sac was stripped from those to  surrounding structures.    I then went about creating a large pocket by lifting the peritoneum of the pelvic floor. I took great care not to injure the iliac vessels.   However due to how adhered the peritoneum was to the pelvic structures a large defect was made in the inferior portion of the peritoneal flap along the pelvic floor.  I then turned my attention to the left groin.  In a similar fashion I made an incision along the peritoneum on the left starting about 2 inches above the anterior superior iliac spine and carrying it medial to where the medial umbilical ligament.  I kept a intact bridge of the medial umbilical ligament intact to to the anterior abdominal wall.  Likewise on this side the peritoneum was densely fused to the inner layer and it was difficult at times to stay above the fat layer and keep the fat layer in contact with the muscle.  Dissection  continued medially and the pubic bone was identified on the left sid this.  Pubic symphysis was identified.  There is no femoral or obturator hernia.  I then went about trying to reduce the indirect hernia sac.  Upon further inspection the patient actually had decompressed sigmoid colon going down his inguinal canal.  It was completely decompressed.  He not only had the epiploic appendage but he also had a loop of sigmoid colon going down the inguinal canal.  I had the assistant exchanged the scissors for a tip up grasper and using gentle traction and countertraction we were able to reduce the incarcerated sigmoid colon.  There was 1 area where it looks like there may have been a superficial serosal tear to the descending colon.  It was not full-thickness.  I would repair that later in the case.  I then went about creating a large flap in the pelvis to accommodate a piece of mesh.  Again the tissue was just friable and oozing on this side like it was on the right side.  I was ultimately able to create a large  pocket to accommodate mesh.   I then obtained the previously placed pieces of Bard large 3D left and right max mesh for each groin and placed it into the respective inguinal area. on each side,   half of it covered medial  to the inferior epigastric vessels and half of it lateral to the  inferior epigastric vessels. The defect was well  covered with the mesh.  I then secured the mesh to Cooper's ligament with two interrupted 3-0 Vicryl sutures on each side.  I placed an additional suture superior medially along the edge of the mesh medial to the inferior gastric vessel on both the left and right inguinal areas.  I placed a 3 Vicryl suture laterally along the superior lateral edge of the mesh lateral to the inferior epigastric vessels on each side as well.  And then I placed an additional interrupted 3-0 Vicryl suture in the middle where both pieces of mesh overlapped to the midline.  I then closed the  peritoneal flap with a running 3-0 Vicryl V-Loc on each side.  the mesh was well covered.  There was a large defect in the peritoneal flap the right side inferiorly.  We removed the needles from the abdomen and I had the scrub place an additional 3 OV lock suture and a 3-0 silk suture in the abdomen.  I then closed the defect in the peritoneal flap on the right with a running 3 OV lock suture.  It was a little bit challenging because the inferior edge of the flap Tearing due to how thin it was.  We had already reduced the intra-abdominal pressure to 8 mmHg.  I was able to bring over some of the median umbilical fold and patch the defect with the 3-0 LOC.  There was no remaining defect in the peritoneal flap.  The mesh was flat on both sides .  It had not curled up.  I then reinspected the descending colon where there appeared to be perhaps a superficial serosal tear which was not an unexpected occurrence given the fact that we had to reduce incarcerated descending/sigmoid colon.  I repaired it with 3 interrupted 3-0 silk sutures.  I inspected the remaining small bowel that was visible and the rest of the colon which all appeared normal.   The surgical robot was undocked and moved away from the OR table.  I scrubbed back in.    we then placed a laparoscopic needle driver and the remaining 2 sutures that had been placed in the abdomen at the the end of the case were removed  There was no evidence of injury to surrounding structures.  Due to the torque that had been used on robotic arm 2 in the left mid abdomen I decided to closed that fascial defect with a single interrupted 0 Vicryl using a PMI suture passer using laparoscopic guidance.  There is no palpable defect or air leak at that location.  The other trocar fascial defects did not look stretched or dilated.  Pneumoperitoneum was released, and the remaining trocars were removed. All skin incisions  were closed with a 4-0 Monocryl in a subcuticular fashion  followed by application of Dermabond. All needle, instrument, and sponge counts  were correct x2.   There are no immediate complications.  Foley catheter was removed. The patient tolerated the procedure well. The patient was extubated and taken to the  recovery room in stable condition.  Mary Sella. Andrey Campanile, MD, FACS General, Bariatric, & Minimally Invasive Surgery [facility],  A [facility]

## 2023-04-01 NOTE — Discharge Instructions (Signed)

## 2023-04-01 NOTE — Anesthesia Preprocedure Evaluation (Addendum)
Anesthesia Evaluation  Patient identified by MRN, date of birth, ID band Patient awake    Reviewed: Allergy & Precautions, H&P , NPO status , Patient's Chart, lab work & pertinent test results  Airway Mallampati: III  TM Distance: >3 FB Neck ROM: Full    Dental no notable dental hx. (+) Teeth Intact, Dental Advisory Given, Chipped, Poor Dentition,    Pulmonary sleep apnea (doesnt wear CPAP anymore)    Pulmonary exam normal breath sounds clear to auscultation       Cardiovascular hypertension (134/86 no home meds), Normal cardiovascular exam Rhythm:Regular Rate:Normal     Neuro/Psych  PSYCHIATRIC DISORDERS Anxiety Depression    negative neurological ROS     GI/Hepatic negative GI ROS, Neg liver ROS,,,  Endo/Other  negative endocrine ROS    Renal/GU negative Renal ROS  negative genitourinary   Musculoskeletal negative musculoskeletal ROS (+)    Abdominal   Peds negative pediatric ROS (+)  Hematology negative hematology ROS (+)   Anesthesia Other Findings   Reproductive/Obstetrics negative OB ROS                             Anesthesia Physical Anesthesia Plan  ASA: 3  Anesthesia Plan: General   Post-op Pain Management: Tylenol PO (pre-op)*   Induction: Intravenous  PONV Risk Score and Plan: 3 and Ondansetron, Dexamethasone and Treatment may vary due to age or medical condition  Airway Management Planned: Oral ETT  Additional Equipment: None  Intra-op Plan:   Post-operative Plan: Extubation in OR  Informed Consent: I have reviewed the patients History and Physical, chart, labs and discussed the procedure including the risks, benefits and alternatives for the proposed anesthesia with the patient or authorized representative who has indicated his/her understanding and acceptance.     Dental advisory given  Plan Discussed with: CRNA  Anesthesia Plan Comments:         Anesthesia Quick Evaluation

## 2023-04-04 ENCOUNTER — Non-Acute Institutional Stay (SKILLED_NURSING_FACILITY): Payer: Medicare Other | Admitting: Internal Medicine

## 2023-04-04 ENCOUNTER — Encounter (HOSPITAL_COMMUNITY): Payer: Self-pay | Admitting: General Surgery

## 2023-04-04 DIAGNOSIS — D509 Iron deficiency anemia, unspecified: Secondary | ICD-10-CM | POA: Diagnosis not present

## 2023-04-04 DIAGNOSIS — Z9889 Other specified postprocedural states: Secondary | ICD-10-CM

## 2023-04-04 DIAGNOSIS — F429 Obsessive-compulsive disorder, unspecified: Secondary | ICD-10-CM | POA: Diagnosis not present

## 2023-04-04 DIAGNOSIS — K581 Irritable bowel syndrome with constipation: Secondary | ICD-10-CM

## 2023-04-04 DIAGNOSIS — Z8719 Personal history of other diseases of the digestive system: Secondary | ICD-10-CM

## 2023-04-04 NOTE — Progress Notes (Unsigned)
Location:  [facility] Room Number: NO/157/A Place of Service:  SNF [id number]) Provider:  Mahlon Gammon, MD  Patient Care Team: Mahlon Gammon, MD as PCP - General (Internal Medicine)  Extended Emergency Contact Information Primary Emergency Contact: Shutt,[place] Mobile Phone: [phone] Relation: Daughter Interpreter needed? No Secondary Emergency Contact: Coller,David Mobile Phone: [phone] Relation: Son Interpreter needed? No  Code Status:  FULL Goals of care: Advanced Directive information    04/04/2023   10:24 AM  Advanced Directives  Does Patient Have a Medical Advance Directive? Yes  Type of Estate agent of [place];Living will  Does patient want to make changes to medical advance directive? No - Patient declined  Copy of Healthcare Power of Attorney in Chart? Yes - validated most recent copy scanned in chart (See row information)     Chief Complaint  Patient presents with   Medical Management of Chronic Issues    Patient is here for medication review and admission follow up after hospital stay     HPI:  Pt is a 77 y.o. male seen today for medical management of chronic diseases.    Patient admitted to Rehab in [facility] [place] Patient underwent robotic minimally invasive bilateral inguinal hernia repair with mesh on 04/01/2023 He is now in Rehab Doing well Bowels moving Pain controlled Walking with his walker Eating with no nausea or vomiting  Patient usually lives in [place]. 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, Anxiety and OCD behaviors,h/o Prostate Cancer 6 years ago s/p Radiation therapy Seeing Dr Arita Miss in Urology  Past Medical History:  Diagnosis Date   Anemia    Anxiety    Per [facility] new patient packet   Depression    High blood pressure    Per   High cholesterol    Per [facility] new [facility] packet   IBS (irritable bowel syndrome)    Per [facility] new [facility] packet    OCD (obsessive compulsive disorder)    Per [facility] new patient packet   Prostate cancer (HCC)    Per [facility] new [facility] packet   Sleep apnea    Past Surgical History:  Procedure Laterality Date   COLONOSCOPY     TONSILECTOMY, ADENOIDECTOMY, BILATERAL MYRINGOTOMY AND TUBES     Per [facility] new patient packet   XI ROBOTIC ASSISTED INGUINAL HERNIA REPAIR WITH MESH Bilateral 04/01/2023   Procedure: XI ROBOTIC REPAIR RIGHT INGUINAL HERNIA REPAIR WITH MESH, XI ROOBOTIC REPAIR INCARCERATED LEFT INGUINAL HERNIA WITH MESH, LAPAROSCOPIC TAP BLOCK;  Surgeon: Gaynelle Adu, MD;  Location: [facility];  Service: General;  Laterality: Bilateral;    Allergies  Allergen Reactions   Nsaids Other (See Comments)    Kidney disease per Patient    Outpatient Encounter Medications as of 04/04/2023  Medication Sig   acetaminophen (TYLENOL) 500 MG tablet Take 2 tablets (1,000 mg total) by mouth every 8 (eight) hours for 5 days.   bisacodyl (DULCOLAX) 10 MG suppository Place 1 suppository (10 mg total) rectally as needed for moderate constipation.   Carboxymethylcellulose Sodium (ARTIFICIAL TEARS OP) Place 2 drops into both eyes 2 (two) times daily.   ferrous sulfate 325 (65 FE) MG EC tablet Take 325 mg by mouth every Monday, Wednesday, and Friday.   LORazepam (ATIVAN) 0.5 MG tablet Take 1 tablet (0.5 mg total) by mouth 3 (three) times daily as needed for anxiety.   lubiprostone (AMITIZA) 24 MCG capsule Take 24 mcg by mouth 2 (two) times daily as  needed for constipation.   mirtazapine (REMERON) 15 MG tablet Take 15 mg by mouth at bedtime.   pantoprazole (PROTONIX) 40 MG tablet Take 40 mg by mouth in the morning.   polyethylene glycol (MIRALAX / GLYCOLAX) 17 g packet Take 17 g by mouth 2 (two) times daily.   Probiotic Product (PROBIOTIC GUMMIES PO) Take 2 each by mouth in the morning.   risperiDONE (RISPERDAL) 3 MG tablet Take 1 tablet by mouth once daily (Patient taking differently: Take 3 mg by mouth at bedtime.)    sennosides-docusate sodium (SENOKOT-S) 8.6-50 MG tablet Take 2 tablets by mouth in the morning and at bedtime. Hold for loose stools   sertraline (ZOLOFT) 100 MG tablet Take 200 mg by mouth at bedtime.   simethicone (MYLICON) 125 MG chewable tablet Chew 125 mg by mouth 2 (two) times daily as needed (gas pain.).   tamsulosin (FLOMAX) 0.4 MG CAPS capsule Take 0.8 mg by mouth every evening.   traMADol (ULTRAM) 50 MG tablet Take 1 tablet (50 mg total) by mouth every 6 (six) hours as needed for up to 7 days.   No facility-administered encounter medications on file as of 04/04/2023.    Review of Systems  Constitutional:  Negative for activity change, appetite change and unexpected weight change.  HENT: Negative.    Respiratory:  Negative for cough and shortness of breath.   Cardiovascular:  Positive for leg swelling.  Gastrointestinal:  Negative for constipation.  Genitourinary:  Negative for frequency.  Musculoskeletal:  Negative for arthralgias, gait problem and myalgias.  Skin: Negative.  Negative for rash.  Neurological:  Negative for dizziness and weakness.  Psychiatric/Behavioral:  Positive for dysphoric mood. Negative for confusion and sleep disturbance.   All other systems reviewed and are negative.   Immunization History  Administered Date(s) Administered   Influenza, High Dose Seasonal PF 07/16/2022   Influenza-Unspecified 07/12/2017, 07/26/2018, 09/18/2019, 07/16/2022   Moderna Sars-Covid-2 Vaccination 08/16/2022   Pneumococcal Conjugate PCV 7 08/06/2015   Pneumococcal Polysaccharide-23 08/13/2013   Pneumococcal-Unspecified 08/06/2015   Td 12/05/2008   Td (Adult) 12/05/2008   Tdap 11/24/2022   Pertinent  Health Maintenance Due  Topic Date Due   INFLUENZA VACCINE  05/12/2023   Colonoscopy  03/09/2032      07/21/2022    8:05 AM 12/20/2022    1:26 PM 02/01/2023    1:43 PM 03/28/2023    1:04 PM 04/04/2023   10:25 AM  Fall Risk  Falls in the past year?  0 0 0 0  Was there  an injury with Fall? 0 0 0 0 0  Fall Risk Category Calculator  0 0 0 0  Patient at Risk for Falls Due to   No Fall Risks No Fall Risks History of fall(s)  Patient at Risk for Falls Due to - Comments     patient is a hight fall risk with no current fall injurys  Fall risk Follow up  Falls evaluation completed Falls evaluation completed Falls evaluation completed Falls evaluation completed   Functional Status Survey:    Vitals:   04/04/23 1027  BP: 125/80  Pulse: 81  Resp: 18  Temp: 97.9 F (36.6 C)  SpO2: 98%  Weight: 196 lb 3.2 oz (89 kg)  Height: 5\' 10"  (1.778 m)   Body mass index is 28.15 kg/m. Physical Exam Vitals reviewed.  Constitutional:      Appearance: Normal appearance.  HENT:     Head: Normocephalic.     Nose: Nose normal.  Mouth/Throat:     Mouth: Mucous membranes are moist.     Pharynx: Oropharynx is clear.  Eyes:     Pupils: Pupils are equal, round, and reactive to light.  Cardiovascular:     Rate and Rhythm: Normal rate and regular rhythm.     Pulses: Normal pulses.     Heart sounds: No murmur heard. Pulmonary:     Effort: Pulmonary effort is normal. No respiratory distress.     Breath sounds: Normal breath sounds. No rales.  Abdominal:     General: Abdomen is flat. Bowel sounds are normal.     Palpations: Abdomen is soft.     Comments: Has 3 surgical incisions with some redness but no Pain  Musculoskeletal:        General: No swelling.     Cervical back: Neck supple.  Skin:    General: Skin is warm.  Neurological:     General: No focal deficit present.     Mental Status: He is alert and oriented to person, place, and time.  Psychiatric:        Mood and Affect: Mood normal.        Thought Content: Thought content normal.     Labs reviewed: Recent Labs    11/08/22 2013 11/23/22 0000 03/21/23 1339  NA 138 136* 134*  K 4.5 4.3 4.0  CL 100 96* 98  CO2 23* 26* 25  GLUCOSE  --   --  120*  BUN 17 19 11   CREATININE 1.2 1.2 1.05  CALCIUM  9.1 8.8 8.9   Recent Labs    11/23/22 0000  AST 19  ALT 8*  ALKPHOS 71  ALBUMIN 4.4   Recent Labs    12/06/22 0000 12/28/22 0000 03/21/23 1339  WBC 4.8 4.9 6.0  HGB 10.0* 12.3* 13.0  HCT 31* 39* 40.9  MCV  --   --  88.0  PLT 163 156 156   Lab Results  Component Value Date   TSH 1.60 12/06/2022   No results found for: "HGBA1C" Lab Results  Component Value Date   CHOL 268 (A) 11/23/2022   HDL 51 11/23/2022   LDLCALC 192 11/23/2022   TRIG 127 11/23/2022   CHOLHDL 3.8 02/03/2022    Significant Diagnostic Results in last 30 days:  No results found.  Assessment/Plan 1. S/P hernia surgery Doing well Bowels are moving Pain Controlled  2. Irritable bowel syndrome with constipation Miralax and Amitiza  3. Obsessive-compulsive disorder, unspecified type Zoloft and Risperdal per Dr Betti Cruz  4. Iron deficiency anemia, unspecified iron deficiency anemia type Low dose of iron 5 Urinary Issues On Flomax 6 GERD On Protonix   Family/ staff Communication:   Labs/tests ordered:

## 2023-04-24 IMAGING — CT CT HEAD W/O CM
3 series · 16 of 47 positions shown, 19 images · non-contrast
Comparison: None Available.

CLINICAL DATA: Altered mental status



[Series 2: head wo · axial · 0.47mm/px · z∈[+1122,+1252]mm · 10 of 32 slices shown, 13 images]
[im 3/32  brain]
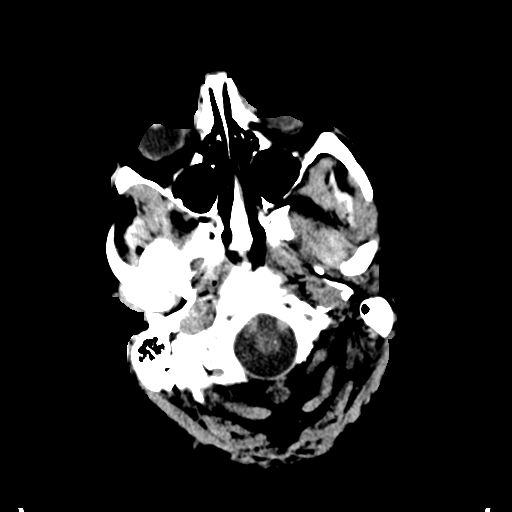
[im 3/32  bone]
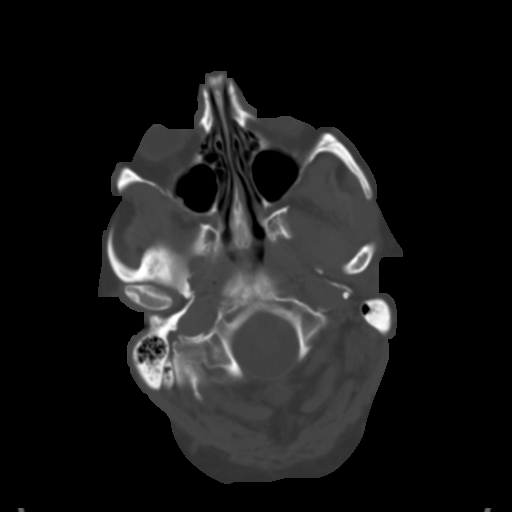
[im 6/32  brain]
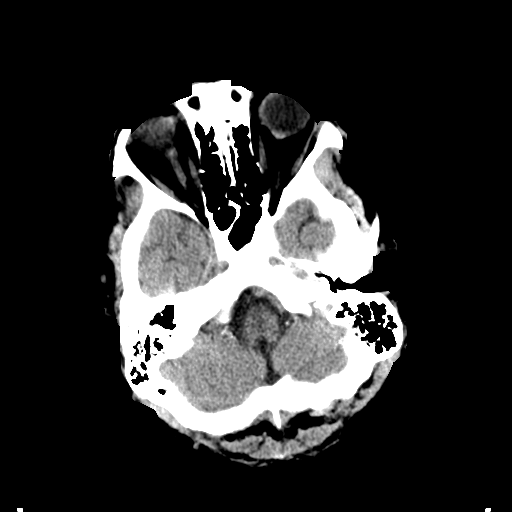
[im 9/32  brain]
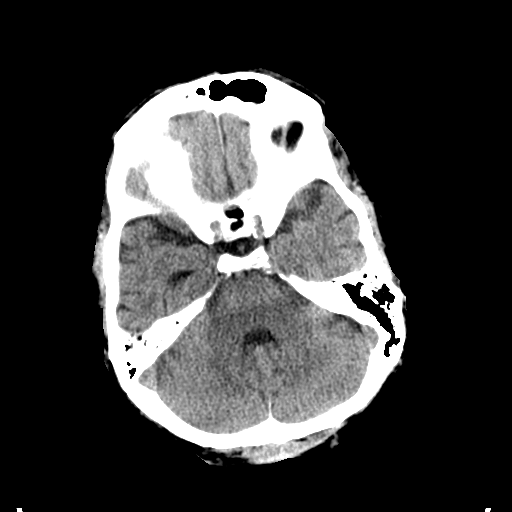
[im 11/32  brain]
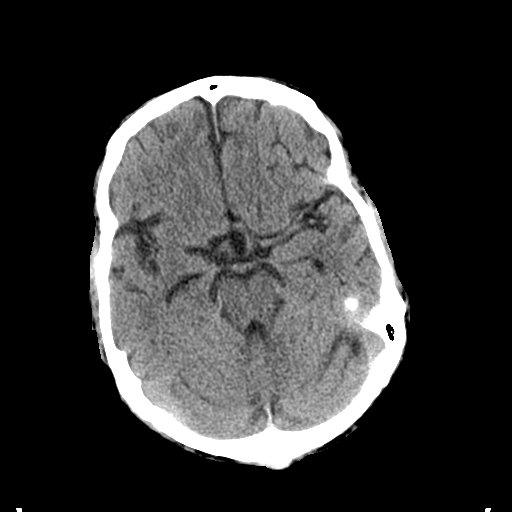
[im 14/32  brain]
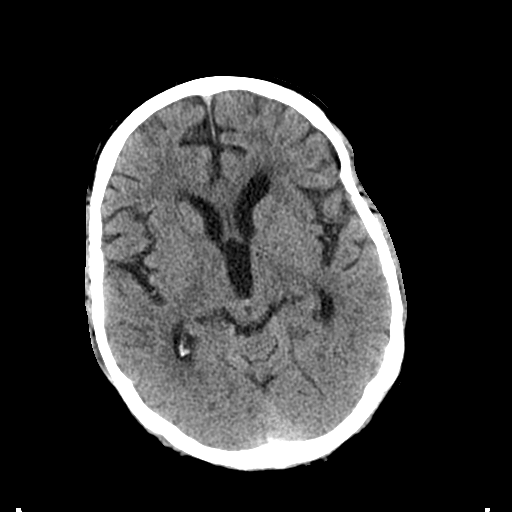
[im 14/32  bone]
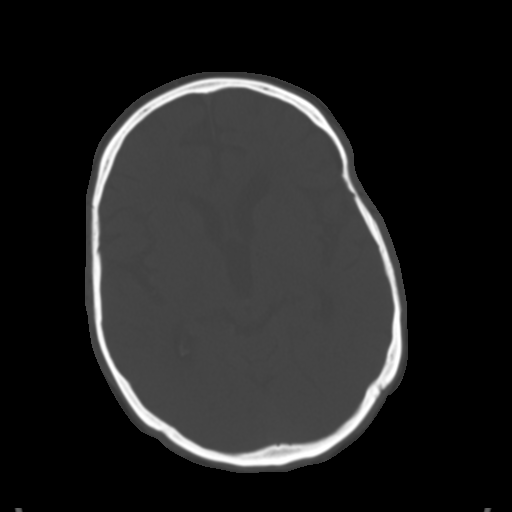
[im 18/32  brain]
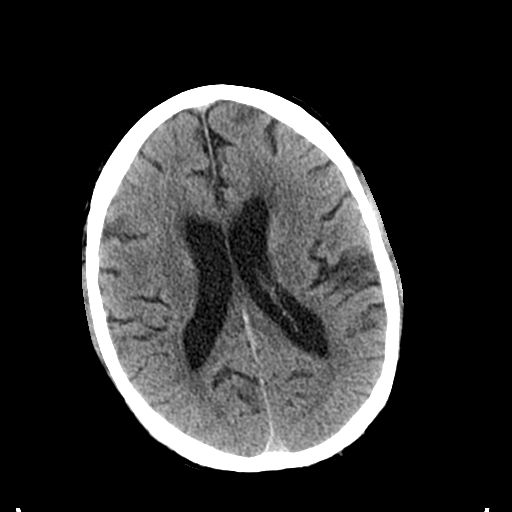
[im 21/32  brain]
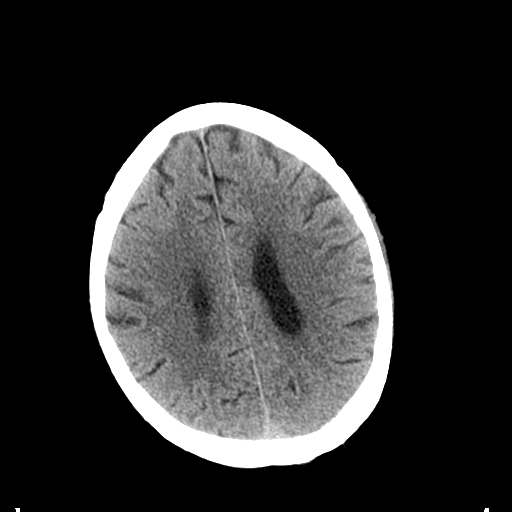
[im 24/32  brain]
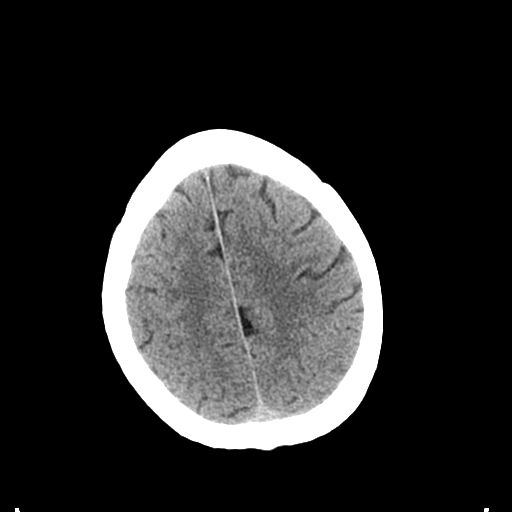
[im 26/32  brain]
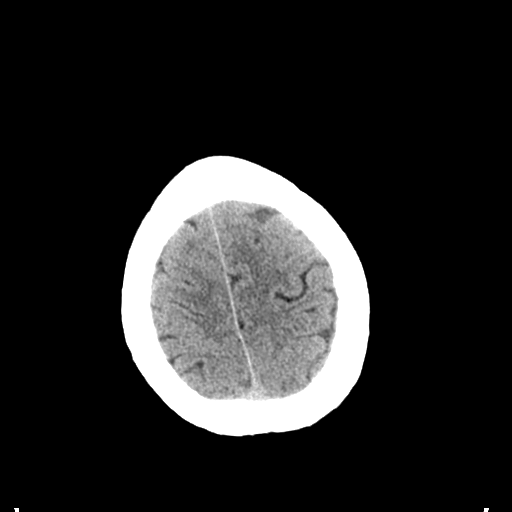
[im 26/32  bone]
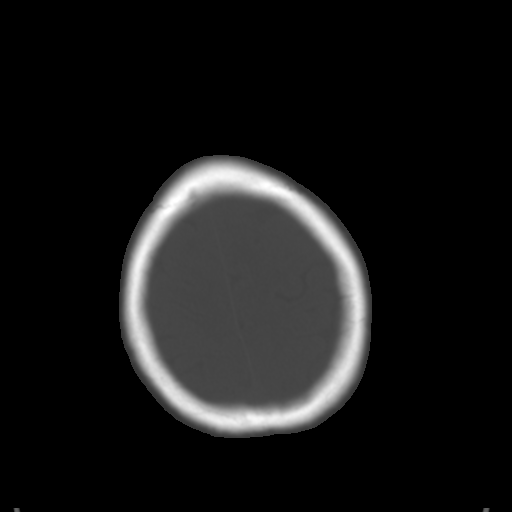
[im 29/32  brain]
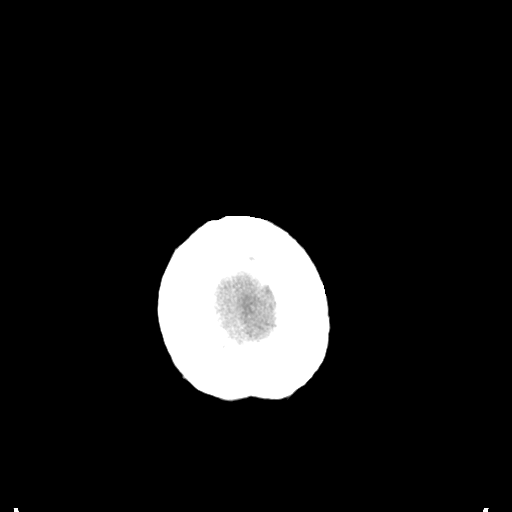

[Series 4: coronal soft tissue · coronal · 0.30mm/px · 3 of 67 slices shown]
[im 23/67  brain]
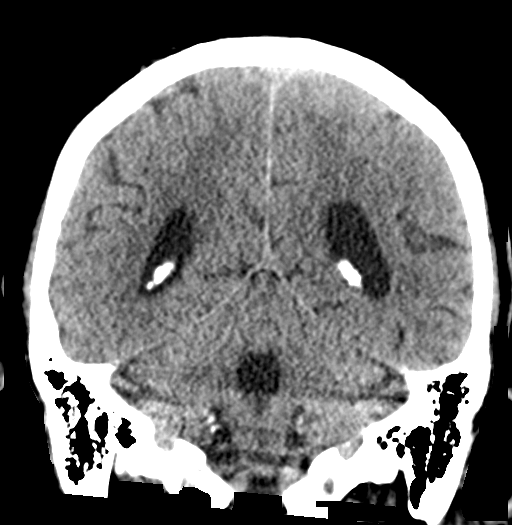
[im 30/67  brain]
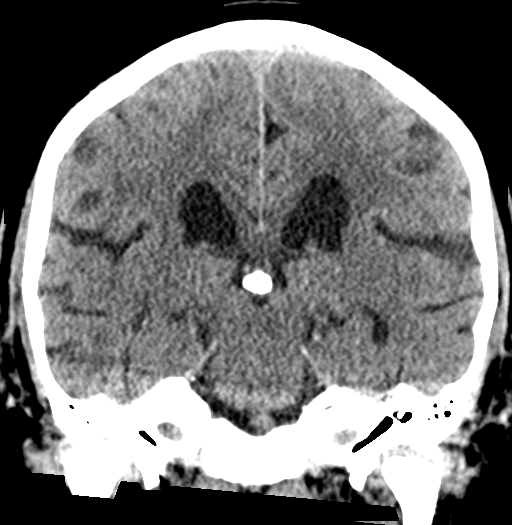
[im 37/67  brain]
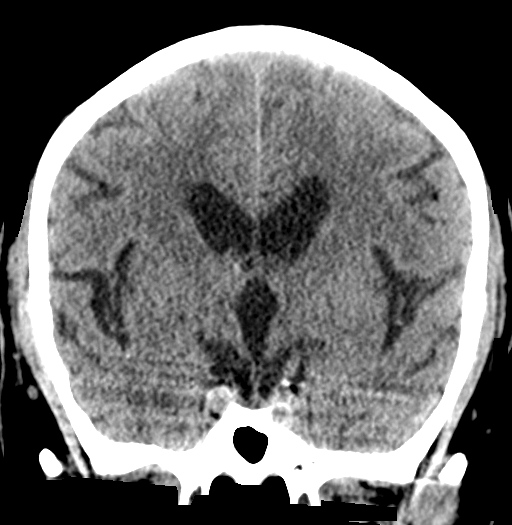

[Series 5: sagittal soft tissue · sagittal · 0.31mm/px · 3 of 52 slices shown]
[im 18/52  brain]
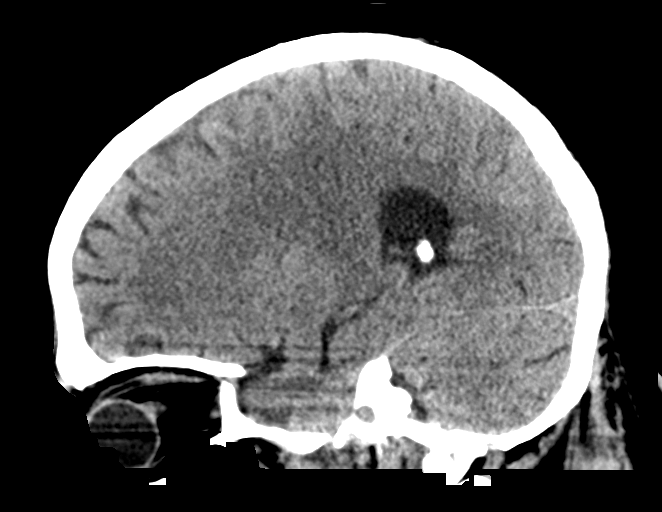
[im 26/52  brain]
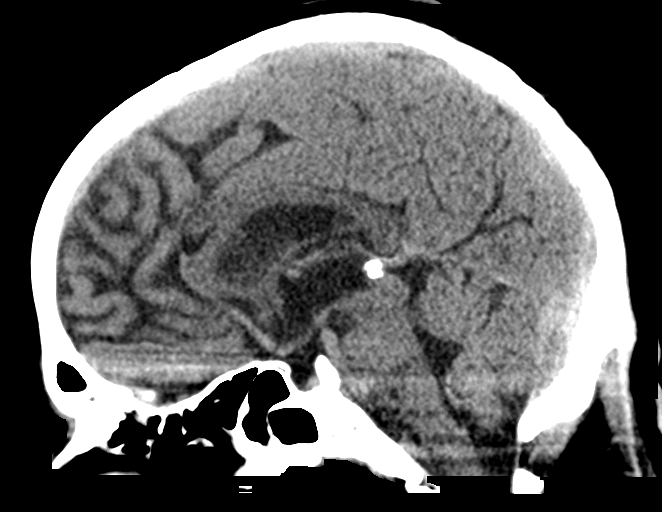
[im 35/52  brain]
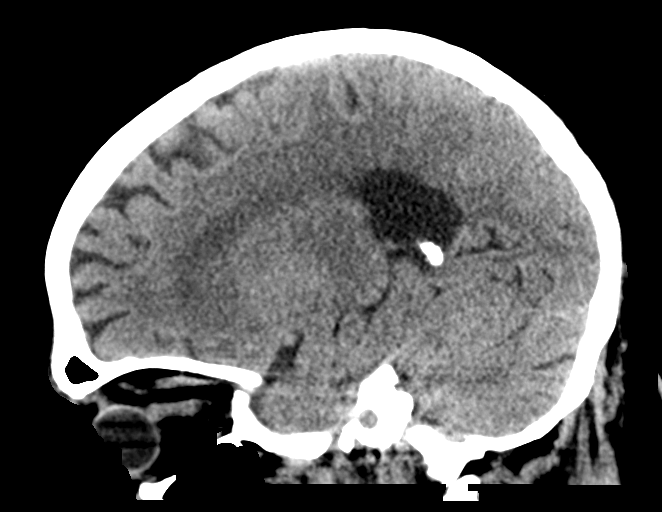

[16 of 47 positions shown; findings below may reference images not displayed]

FINDINGS: Brain: No evidence of acute infarction, hemorrhage, hydrocephalus,
extra-axial collection or mass lesion/mass effect.

Vascular: No hyperdense vessel or unexpected calcification.

Skull: Normal. Negative for fracture or focal lesion.

Sinuses/Orbits: No acute finding.

Other: None.
IMPRESSION: No acute intracranial abnormality noted.

## 2023-05-02 ENCOUNTER — Non-Acute Institutional Stay: Payer: Medicare Other | Admitting: Adult Health

## 2023-05-02 ENCOUNTER — Encounter: Payer: Self-pay | Admitting: Adult Health

## 2023-05-02 VITALS — BP 128/74 | HR 99 | Temp 98.5°F | Resp 16 | Ht 70.0 in | Wt 195.0 lb

## 2023-05-02 DIAGNOSIS — F429 Obsessive-compulsive disorder, unspecified: Secondary | ICD-10-CM

## 2023-05-02 DIAGNOSIS — Z9889 Other specified postprocedural states: Secondary | ICD-10-CM | POA: Diagnosis not present

## 2023-05-02 DIAGNOSIS — D509 Iron deficiency anemia, unspecified: Secondary | ICD-10-CM | POA: Diagnosis not present

## 2023-05-02 DIAGNOSIS — N401 Enlarged prostate with lower urinary tract symptoms: Secondary | ICD-10-CM

## 2023-05-02 DIAGNOSIS — R3912 Poor urinary stream: Secondary | ICD-10-CM

## 2023-05-02 DIAGNOSIS — E782 Mixed hyperlipidemia: Secondary | ICD-10-CM

## 2023-05-02 DIAGNOSIS — K581 Irritable bowel syndrome with constipation: Secondary | ICD-10-CM | POA: Diagnosis not present

## 2023-05-02 DIAGNOSIS — Z8719 Personal history of other diseases of the digestive system: Secondary | ICD-10-CM

## 2023-05-02 MED ORDER — RISPERIDONE 3 MG PO TABS
3.0000 mg | ORAL_TABLET | Freq: Every day | ORAL | Status: DC
Start: 2023-05-02 — End: 2023-12-19

## 2023-05-02 NOTE — Progress Notes (Signed)
Location: [facility]   Provider:  Peggye Ley, ANP [facility] [phone]   Code Status: Full Goals of Care:     05/02/2023    1:21 PM  Advanced Directives  Does Patient Have a Medical Advance Directive? Yes  Type of Estate agent of [place];Living will  Does patient want to make changes to medical advance directive? No - Patient declined  Copy of Healthcare Power of Attorney in Chart? Yes - validated most recent copy scanned in chart (See row information)     Chief Complaint  Patient presents with   Medical Management of Chronic Issues    Patient is being seen for a follow up patient was last seen for a hernia     HPI: Patient is a 77 y.o. male seen today for medical management of chronic diseases.  Resides in [place]. Previously lived by himself, Wife deceased. Hx of prostate ca s/p radiation therapy. Followed by Dr Arita Miss with Urology.  PSA 0.095 03/16/23 PVR 43  He is using a walker and doesn't feel its helping. Has a shuffling gait. Last PT recommendation indicates he he does not need a walker.  Had robotic bilateral inguinal hernia repair 04/01/23                                                                                              .     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  Repeat needed in 3 years. He started protonix and iron on 12/07/22 for iron deficiency anemia with Hgb 10 drop from 14, iron 19. Hgb 13 03/21/23.  Only 1/3 heme pos.  Constipation: has bowel obsession. Reports alternating between constipation and diarrhea. Has a BM each day, sometimes more than one. Loose most of the time. No abd pain. No nausea. Good appetite. No fever.   KUB 02/11/23: moderate hiatal hernia. No acute findings.   HLD: Lab Results  Component Value Date   CHOL 268 (A) 11/23/2022   HDL 51 11/23/2022   LDLCALC 192 11/23/2022   TRIG 127 11/23/2022   CHOLHDL 3.8 02/03/2022    Also he is here for a repeat MMSE last one in 2023 was  30/30, repeat 12/20/22 28/30 2pts missed on recall, Has OCD and anxiety. Using risperdal, zoloft, ativan prn.  Has severe anxiety and OCD. See Dr. Betti Cruz.    Past Medical History:  Diagnosis Date   Anemia    Anxiety    Per [facility] new patient packet   Depression    High blood pressure    Per   High cholesterol    Per [facility] new patient packet   IBS (irritable bowel syndrome)    Per [facility] new patient packet   OCD (obsessive compulsive disorder)    Per [facility] new patient packet   Prostate cancer (HCC)    Per [facility] new patient packet   Sleep apnea     Past Surgical History:  Procedure Laterality Date   COLONOSCOPY     TONSILECTOMY, ADENOIDECTOMY, BILATERAL MYRINGOTOMY AND TUBES     Per [facility] new patient packet   XI ROBOTIC ASSISTED  INGUINAL HERNIA REPAIR WITH MESH Bilateral 04/01/2023   Procedure: XI ROBOTIC REPAIR RIGHT INGUINAL HERNIA REPAIR WITH MESH, XI ROOBOTIC REPAIR INCARCERATED LEFT INGUINAL HERNIA WITH MESH, LAPAROSCOPIC TAP BLOCK;  Surgeon: Gaynelle Adu, MD;  Location: [facility];  Service: General;  Laterality: Bilateral;    Allergies  Allergen Reactions   Nsaids Other (See Comments)    Kidney disease per Patient    Outpatient Encounter Medications as of 05/02/2023  Medication Sig   bisacodyl (DULCOLAX) 10 MG suppository Place 1 suppository (10 mg total) rectally as needed for moderate constipation.   Carboxymethylcellulose Sodium (ARTIFICIAL TEARS OP) Place 2 drops into both eyes 2 (two) times daily.   ferrous sulfate 325 (65 FE) MG EC tablet Take 325 mg by mouth every Monday, Wednesday, and Friday.   LORazepam (ATIVAN) 0.5 MG tablet Take 1 tablet (0.5 mg total) by mouth 3 (three) times daily as needed for anxiety.   lubiprostone (AMITIZA) 24 MCG capsule Take 24 mcg by mouth 2 (two) times daily as needed for constipation.   mirtazapine (REMERON) 15 MG tablet Take 15 mg by mouth at bedtime.   pantoprazole (PROTONIX) 40 MG tablet Take 40 mg by mouth in the morning.   polyethylene glycol  (MIRALAX / GLYCOLAX) 17 g packet Take 17 g by mouth 2 (two) times daily.   Probiotic Product (PROBIOTIC GUMMIES PO) Take 2 each by mouth in the morning.   risperiDONE (RISPERDAL) 3 MG tablet Take 1 tablet (3 mg total) by mouth at bedtime.   sennosides-docusate sodium (SENOKOT-S) 8.6-50 MG tablet Take 2 tablets by mouth 2 (two) times daily as needed. Hold for loose stools   sertraline (ZOLOFT) 100 MG tablet Take 200 mg by mouth at bedtime.   simethicone (MYLICON) 125 MG chewable tablet Chew 125 mg by mouth 2 (two) times daily as needed (gas pain.).   tamsulosin (FLOMAX) 0.4 MG CAPS capsule Take 0.8 mg by mouth every evening.   [DISCONTINUED] risperiDONE (RISPERDAL) 3 MG tablet Take 1 tablet by mouth once daily (Patient taking differently: Take 3 mg by mouth at bedtime.)   No facility-administered encounter medications on file as of 05/02/2023.    Review of Systems:  Review of Systems  Constitutional:  Negative for activity change, appetite change, chills, diaphoresis, fatigue, fever and unexpected weight change.  Respiratory:  Negative for cough, shortness of breath, wheezing and stridor.   Cardiovascular:  Negative for chest pain, palpitations and leg swelling.  Gastrointestinal:  Positive for constipation and diarrhea. Negative for abdominal distention and abdominal pain.  Genitourinary:  Positive for frequency. Negative for difficulty urinating and dysuria.  Musculoskeletal:  Positive for gait problem. Negative for arthralgias, back pain, joint swelling and myalgias.  Neurological:  Negative for dizziness, seizures, syncope, facial asymmetry, speech difficulty, weakness and headaches.  Hematological:  Negative for adenopathy. Does not bruise/bleed easily.  Psychiatric/Behavioral:  Positive for dysphoric mood. Negative for agitation, behavioral problems, confusion and sleep disturbance.     Health Maintenance  Topic Date Due   COVID-19 Vaccine (2 - Moderna risk series) 05/21/2023  (Originally 09/13/2022)   Zoster Vaccines- Shingrix (1 of 2) 06/28/2023 (Originally 06/19/1965)   Hepatitis C Screening  03/27/2024 (Originally 06/19/1964)   INFLUENZA VACCINE  05/12/2023   Medicare Annual Wellness (AWV)  03/27/2024   Colonoscopy  03/09/2032   DTaP/Tdap/Td (4 - Td or Tdap) 11/24/2032   HPV VACCINES  Aged Out   Pneumonia Vaccine 71+ Years old  Discontinued    Physical Exam: Vitals:   05/02/23 1322  BP: 128/74  Pulse: 99  Resp: 16  Temp: 98.5 F (36.9 C)  TempSrc: Temporal  SpO2: 95%  Weight: 195 lb (88.5 kg)  Height: 5\' 10"  (1.778 m)   Body mass index is 27.98 kg/m. Physical Exam Constitutional:      General: He is not in acute distress.    Appearance: He is not diaphoretic.  HENT:     Head: Normocephalic and atraumatic.     Right Ear: Tympanic membrane normal.     Left Ear: Tympanic membrane normal.     Nose: Nose normal.     Mouth/Throat:     Mouth: Mucous membranes are moist.     Pharynx: Oropharynx is clear.  Eyes:     Conjunctiva/sclera: Conjunctivae normal.     Pupils: Pupils are equal, round, and reactive to light.  Neck:     Thyroid: No thyromegaly.     Vascular: No JVD.     Trachea: No tracheal deviation.  Cardiovascular:     Rate and Rhythm: Normal rate and regular rhythm.     Heart sounds: No murmur heard. Pulmonary:     Effort: Pulmonary effort is normal. No respiratory distress.     Breath sounds: Normal breath sounds. No wheezing.  Abdominal:     General: Bowel sounds are normal. There is no distension.     Palpations: Abdomen is soft.     Tenderness: There is no abdominal tenderness. There is no right CVA tenderness or left CVA tenderness.     Comments: Lap sites healed, no drainage or redness.   Musculoskeletal:     Right lower leg: No edema.     Left lower leg: No edema.  Lymphadenopathy:     Cervical: No cervical adenopathy.  Skin:    General: Skin is warm and dry.  Neurological:     Mental Status: He is alert and oriented  to person, place, and time.     Cranial Nerves: No cranial nerve deficit.  Psychiatric:        Mood and Affect: Mood normal.     Labs reviewed: Basic Metabolic Panel: Recent Labs    11/08/22 2013 11/23/22 0000 12/06/22 0000 03/21/23 1339  NA 138 136*  --  134*  K 4.5 4.3  --  4.0  CL 100 96*  --  98  CO2 23* 26*  --  25  GLUCOSE  --   --   --  120*  BUN 17 19  --  11  CREATININE 1.2 1.2  --  1.05  CALCIUM 9.1 8.8  --  8.9  TSH  --   --  1.60  --    Liver Function Tests: Recent Labs    11/23/22 0000  AST 19  ALT 8*  ALKPHOS 71  ALBUMIN 4.4   No results for input(s): "LIPASE", "AMYLASE" in the last 8760 hours. No results for input(s): "AMMONIA" in the last 8760 hours. CBC: Recent Labs    12/06/22 0000 12/28/22 0000 03/21/23 1339  WBC 4.8 4.9 6.0  HGB 10.0* 12.3* 13.0  HCT 31* 39* 40.9  MCV  --   --  88.0  PLT 163 156 156   Lipid Panel: Recent Labs    11/23/22 0000  CHOL 268*  HDL 51  LDLCALC 192  TRIG 161   No results found for: "HGBA1C"  Procedures since last visit: No results found.  Assessment/Plan  1. Obsessive-compulsive disorder, unspecified type Continue current meds, followed by psychiatry.  His mood is fairly  well managed but has chronically flat affect   2. Iron deficiency anemia, unspecified iron deficiency anemia type Continue iron  Follow labs   3. S/P hernia surgery resolved  4. Irritable bowel syndrome with constipation Continue miralax scheduled Continue senokot and amitiza prn  5. Mixed hyperlipidemia Elevated LDL at last lab check, will repeat likely will need statin  6. Benign prostatic hyperplasia with weak urinary stream Continue flomax Followed by urology      Labs/tests ordered:  * No order type specified * CBC Iron panel BMP prior to next apt  Next appt:  3 months with Dr Chales Abrahams

## 2023-07-28 LAB — IRON,TIBC AND FERRITIN PANEL
Ferritin: 61.4
Iron: 49
TIBC: 314
UIBC: 265

## 2023-08-02 ENCOUNTER — Non-Acute Institutional Stay: Payer: Medicare Other | Admitting: Internal Medicine

## 2023-08-02 ENCOUNTER — Encounter: Payer: Self-pay | Admitting: Internal Medicine

## 2023-08-02 VITALS — BP 132/74 | HR 97 | Temp 97.9°F | Resp 18 | Ht 70.0 in | Wt 198.2 lb

## 2023-08-02 DIAGNOSIS — F429 Obsessive-compulsive disorder, unspecified: Secondary | ICD-10-CM | POA: Diagnosis not present

## 2023-08-02 DIAGNOSIS — Z9889 Other specified postprocedural states: Secondary | ICD-10-CM | POA: Diagnosis not present

## 2023-08-02 DIAGNOSIS — K581 Irritable bowel syndrome with constipation: Secondary | ICD-10-CM

## 2023-08-02 DIAGNOSIS — R3912 Poor urinary stream: Secondary | ICD-10-CM

## 2023-08-02 DIAGNOSIS — Z8719 Personal history of other diseases of the digestive system: Secondary | ICD-10-CM

## 2023-08-02 DIAGNOSIS — E782 Mixed hyperlipidemia: Secondary | ICD-10-CM | POA: Diagnosis not present

## 2023-08-02 DIAGNOSIS — N401 Enlarged prostate with lower urinary tract symptoms: Secondary | ICD-10-CM

## 2023-08-02 NOTE — Progress Notes (Unsigned)
Location:  [facility] features editor of Service:  Clinic (12)  Provider:   Code Status: *** Goals of Care:     08/02/2023    2:46 PM  Advanced Directives  Does Patient Have a Medical Advance Directive? Yes  Type of Estate agent of [place];Living will  Does patient want to make changes to medical advance directive? No - Patient declined  Copy of Healthcare Power of Attorney in Chart? Yes - validated most recent copy scanned in chart (See row information)     No chief complaint on file.   HPI: Patient is a 77 y.o. male seen today for medical management of chronic diseases.     Past Medical History:  Diagnosis Date   Anemia    Anxiety    Per [facility] new patient packet   Depression    High blood pressure    Per   High cholesterol    Per [facility] new patient packet   IBS (irritable bowel syndrome)    Per [facility] new patient packet   OCD (obsessive compulsive disorder)    Per [facility] new patient packet   Prostate cancer (HCC)    Per [facility] new patient packet   Sleep apnea     Past Surgical History:  Procedure Laterality Date   COLONOSCOPY     TONSILECTOMY, ADENOIDECTOMY, BILATERAL MYRINGOTOMY AND TUBES     Per [facility] new patient packet   XI ROBOTIC ASSISTED INGUINAL HERNIA REPAIR WITH MESH Bilateral 04/01/2023   Procedure: XI ROBOTIC REPAIR RIGHT INGUINAL HERNIA REPAIR WITH MESH, XI ROOBOTIC REPAIR INCARCERATED LEFT INGUINAL HERNIA WITH MESH, LAPAROSCOPIC TAP BLOCK;  Surgeon: Gaynelle Adu, MD;  Location: [facility];  Service: General;  Laterality: Bilateral;    Allergies  Allergen Reactions   Nsaids Other (See Comments)    Kidney disease per Patient    Outpatient Encounter Medications as of 08/02/2023  Medication Sig   bisacodyl (DULCOLAX) 10 MG suppository Place 1 suppository (10 mg total) rectally as needed for moderate constipation.   Carboxymethylcellulose Sodium (ARTIFICIAL TEARS OP) Place 2 drops into both eyes 2 (two) times daily.   ferrous  sulfate 325 (65 FE) MG EC tablet Take 325 mg by mouth every Monday, Wednesday, and Friday.   LORazepam (ATIVAN) 0.5 MG tablet Take 1 tablet (0.5 mg total) by mouth 3 (three) times daily as needed for anxiety.   lubiprostone (AMITIZA) 24 MCG capsule Take 24 mcg by mouth 2 (two) times daily as needed for constipation.   mirtazapine (REMERON) 15 MG tablet Take 15 mg by mouth at bedtime.   pantoprazole (PROTONIX) 40 MG tablet Take 40 mg by mouth in the morning.   polyethylene glycol (MIRALAX / GLYCOLAX) 17 g packet Take 17 g by mouth 2 (two) times daily.   Probiotic Product (PROBIOTIC GUMMIES PO) Take 2 each by mouth in the morning.   risperiDONE (RISPERDAL) 3 MG tablet Take 1 tablet (3 mg total) by mouth at bedtime.   sennosides-docusate sodium (SENOKOT-S) 8.6-50 MG tablet Take 2 tablets by mouth 2 (two) times daily as needed. Hold for loose stools   sertraline (ZOLOFT) 100 MG tablet Take 200 mg by mouth at bedtime.   simethicone (MYLICON) 125 MG chewable tablet Chew 125 mg by mouth 2 (two) times daily as needed (gas pain.).   tamsulosin (FLOMAX) 0.4 MG CAPS capsule Take 0.8 mg by mouth every evening.   No facility-administered encounter medications on file as of 08/02/2023.    Review of Systems:  Review of Systems  Constitutional:  Positive for activity change. Negative for appetite change and unexpected weight change.  HENT: Negative.    Respiratory:  Negative for cough and shortness of breath.   Cardiovascular:  Negative for leg swelling.  Gastrointestinal:  Positive for constipation and diarrhea.  Genitourinary:  Negative for frequency.  Musculoskeletal:  Positive for gait problem. Negative for arthralgias and myalgias.  Skin: Negative.  Negative for rash.  Neurological:  Positive for tremors. Negative for dizziness and weakness.  Psychiatric/Behavioral:  Positive for confusion. Negative for sleep disturbance.   All other systems reviewed and are negative.   Health Maintenance  Topic  Date Due   Zoster Vaccines- Shingrix (1 of 2) Never done   Hepatitis C Screening  03/27/2024 (Originally 06/19/1964)   COVID-19 Vaccine (3 - Moderna risk series) 08/29/2023   Medicare Annual Wellness (AWV)  03/27/2024   DTaP/Tdap/Td (4 - Td or Tdap) 11/24/2032   INFLUENZA VACCINE  Completed   HPV VACCINES  Aged Out   Pneumonia Vaccine 91+ Years old  Discontinued   Colonoscopy  Discontinued    Physical Exam: Vitals:   08/02/23 1447  BP: 132/74  Pulse: 97  Resp: 18  Temp: 97.9 F (36.6 C)  TempSrc: Temporal  SpO2: 95%  Weight: 198 lb 3.2 oz (89.9 kg)  Height: 5\' 10"  (1.778 m)   Body mass index is 28.44 kg/m. Physical Exam Vitals reviewed.  Constitutional:      Appearance: Normal appearance.  HENT:     Head: Normocephalic.     Right Ear: Tympanic membrane normal.     Left Ear: Tympanic membrane normal.     Nose: Nose normal.     Mouth/Throat:     Mouth: Mucous membranes are moist.     Pharynx: Oropharynx is clear.  Eyes:     Pupils: Pupils are equal, round, and reactive to light.  Cardiovascular:     Rate and Rhythm: Normal rate and regular rhythm.     Pulses: Normal pulses.     Heart sounds: No murmur heard. Pulmonary:     Effort: Pulmonary effort is normal. No respiratory distress.     Breath sounds: Normal breath sounds. No rales.  Abdominal:     General: Abdomen is flat. Bowel sounds are normal.     Palpations: Abdomen is soft.  Musculoskeletal:        General: No swelling.     Cervical back: Neck supple.  Skin:    General: Skin is warm.  Neurological:     General: No focal deficit present.     Mental Status: He is alert and oriented to person, place, and time.     Comments: Has Resting Tremors in both hands  Psychiatric:        Mood and Affect: Mood normal.        Thought Content: Thought content normal.     Comments: Flat Affect     Labs reviewed: Basic Metabolic Panel: Recent Labs    11/08/22 2013 11/23/22 0000 12/06/22 0000 03/21/23 1339   NA 138 136*  --  134*  K 4.5 4.3  --  4.0  CL 100 96*  --  98  CO2 23* 26*  --  25  GLUCOSE  --   --   --  120*  BUN 17 19  --  11  CREATININE 1.2 1.2  --  1.05  CALCIUM 9.1 8.8  --  8.9  TSH  --   --  1.60  --  Liver Function Tests: Recent Labs    11/23/22 0000  AST 19  ALT 8*  ALKPHOS 71  ALBUMIN 4.4   No results for input(s): "LIPASE", "AMYLASE" in the last 8760 hours. No results for input(s): "AMMONIA" in the last 8760 hours. CBC: Recent Labs    12/06/22 0000 12/28/22 0000 03/21/23 1339  WBC 4.8 4.9 6.0  HGB 10.0* 12.3* 13.0  HCT 31* 39* 40.9  MCV  --   --  88.0  PLT 163 156 156   Lipid Panel: Recent Labs    11/23/22 0000  CHOL 268*  HDL 51  LDLCALC 192  TRIG 161   No results found for: "HGBA1C"  Procedures since last visit: No results found.  Assessment/Plan 1. Obsessive-compulsive disorder, unspecified type ***  2. S/P hernia surgery ***  3. Irritable bowel syndrome with constipation ***  4. Mixed hyperlipidemia ***  5. Benign prostatic hyperplasia with weak urinary stream ***    Labs/tests ordered:  Lipid Panel before next visit Next appt:  Visit date not found

## 2023-08-03 ENCOUNTER — Encounter: Payer: Self-pay | Admitting: Internal Medicine

## 2023-08-03 ENCOUNTER — Encounter: Payer: Self-pay | Admitting: Orthopedic Surgery

## 2023-08-03 ENCOUNTER — Non-Acute Institutional Stay: Payer: Self-pay | Admitting: Orthopedic Surgery

## 2023-08-03 DIAGNOSIS — F429 Obsessive-compulsive disorder, unspecified: Secondary | ICD-10-CM

## 2023-08-03 DIAGNOSIS — R42 Dizziness and giddiness: Secondary | ICD-10-CM | POA: Diagnosis not present

## 2023-08-03 DIAGNOSIS — K581 Irritable bowel syndrome with constipation: Secondary | ICD-10-CM | POA: Diagnosis not present

## 2023-08-03 DIAGNOSIS — R531 Weakness: Secondary | ICD-10-CM

## 2023-08-03 LAB — BASIC METABOLIC PANEL
BUN: 15 (ref 4–21)
CO2: 20 (ref 13–22)
Chloride: 101 (ref 99–108)
Creatinine: 1.3 (ref 0.6–1.3)
Glucose: 101
Potassium: 4.4 meq/L (ref 3.5–5.1)
Sodium: 136 — AB (ref 137–147)

## 2023-08-03 LAB — HEPATIC FUNCTION PANEL
ALT: 10 U/L (ref 10–40)
AST: 15 (ref 14–40)
Alkaline Phosphatase: 66 (ref 25–125)
Bilirubin, Total: 0.6

## 2023-08-03 LAB — COMPREHENSIVE METABOLIC PANEL
Albumin: 4.5 (ref 3.5–5.0)
Calcium: 8.8 (ref 8.7–10.7)
eGFR: 55

## 2023-08-03 LAB — CBC: RBC: 4.4 (ref 3.87–5.11)

## 2023-08-03 LAB — CBC AND DIFFERENTIAL
HCT: 39 — AB (ref 41–53)
Hemoglobin: 13.2 — AB (ref 13.5–17.5)
Platelets: 124 10*3/uL — AB (ref 150–400)
WBC: 6.1

## 2023-08-03 NOTE — Progress Notes (Signed)
Location:  [facility] Room Number: 610/A Place of Service:  ALF [id number]) Provider:  Octavia Heir, NP   Mahlon Gammon, MD  Patient Care Team: Mahlon Gammon, MD as PCP - General (Internal Medicine)  Extended Emergency Contact Information Primary Emergency Contact: Mroz,[place] Mobile Phone: [phone] Relation: Daughter Interpreter needed? No Secondary Emergency Contact: Ice,David Mobile Phone: [phone] Relation: Son Interpreter needed? No  Code Status:  Full code Goals of care: Advanced Directive information    08/02/2023    2:46 PM  Advanced Directives  Does Patient Have a Medical Advance Directive? Yes  Type of Estate agent of [place];Living will  Does patient want to make changes to medical advance directive? No - Patient declined  Copy of Healthcare Power of Attorney in Chart? Yes - validated most recent copy scanned in chart (See row information)     Chief Complaint  Patient presents with   Acute Visit    dizziness    HPI:  Pt is a 77 y.o. male seen today for acute visit due to increased weakness and dizziness.   He currently resides on the assisted living unit at [facility]. PMH: HTN, IBS, prostate cancer s/p radiation, s/p bilateral hernia repair with mesh 03/25/2023, constipation> colonoscopy 2023>tubular adenoma, anxiety and OCD.   Nursing reports increased dizziness this morning. He needed assistance back to his room with wheelchair. After event, he was unable to ambulate due to weakness. He received flu and covid vaccinations yesterday. He denies cold symptoms. He reports he is worried his stomach is not working. Admits to moving bowels and some diarrhea within past week. He was able to eat oatmeal this morning. He has not had lunch. Only drinking orange juice at this time. When I entered his apartment he was ambulating in his bathroom without difficulty. He was not using his walker. I reminded  him of falls safety. Afebrile. Vitals stable.   Orthostatic blood pressures after encounter:   Laying: 122/71  Sitting: 109/69  Standing: 120/78   Past Medical History:  Diagnosis Date   Anemia    Anxiety    Per [facility] new patient packet   Depression    High blood pressure    Per   High cholesterol    Per [facility] new patient packet   IBS (irritable bowel syndrome)    Per [facility] new patient packet   OCD (obsessive compulsive disorder)    Per [facility] new patient packet   Prostate cancer (HCC)    Per [facility] new patient packet   Sleep apnea    Past Surgical History:  Procedure Laterality Date   COLONOSCOPY     TONSILECTOMY, ADENOIDECTOMY, BILATERAL MYRINGOTOMY AND TUBES     Per [facility] new patient packet   XI ROBOTIC ASSISTED INGUINAL HERNIA REPAIR WITH MESH Bilateral 04/01/2023   Procedure: XI ROBOTIC REPAIR RIGHT INGUINAL HERNIA REPAIR WITH MESH, XI ROOBOTIC REPAIR INCARCERATED LEFT INGUINAL HERNIA WITH MESH, LAPAROSCOPIC TAP BLOCK;  Surgeon: Gaynelle Adu, MD;  Location: [facility];  Service: General;  Laterality: Bilateral;    Allergies  Allergen Reactions   Nsaids Other (See Comments)    Kidney disease per Patient    Outpatient Encounter Medications as of 08/03/2023  Medication Sig   bisacodyl (DULCOLAX) 10 MG suppository Place 1 suppository (10 mg total) rectally as needed for moderate constipation.   Carboxymethylcellulose Sodium (ARTIFICIAL TEARS OP) Place 2 drops into both eyes 2 (two) times daily.   ferrous sulfate 325 (65 FE) MG EC  tablet Take 325 mg by mouth every Monday, Wednesday, and Friday.   LORazepam (ATIVAN) 0.5 MG tablet Take 1 tablet (0.5 mg total) by mouth 3 (three) times daily as needed for anxiety.   lubiprostone (AMITIZA) 24 MCG capsule Take 24 mcg by mouth 2 (two) times daily as needed for constipation.   mirtazapine (REMERON) 15 MG tablet Take 15 mg by mouth at bedtime.   pantoprazole (PROTONIX) 40 MG tablet Take 40 mg by mouth in the morning.   polyethylene glycol  (MIRALAX / GLYCOLAX) 17 g packet Take 17 g by mouth 2 (two) times daily.   Probiotic Product (PROBIOTIC GUMMIES PO) Take 2 each by mouth in the morning.   risperiDONE (RISPERDAL) 3 MG tablet Take 1 tablet (3 mg total) by mouth at bedtime.   sennosides-docusate sodium (SENOKOT-S) 8.6-50 MG tablet Take 2 tablets by mouth 2 (two) times daily as needed. Hold for loose stools   sertraline (ZOLOFT) 100 MG tablet Take 200 mg by mouth at bedtime.   simethicone (MYLICON) 125 MG chewable tablet Chew 125 mg by mouth 2 (two) times daily as needed (gas pain.).   tamsulosin (FLOMAX) 0.4 MG CAPS capsule Take 0.8 mg by mouth every evening.   No facility-administered encounter medications on file as of 08/03/2023.    Review of Systems  Constitutional:  Positive for activity change, appetite change and fatigue. Negative for fever.  HENT:  Negative for congestion and sore throat.   Respiratory:  Negative for cough.   Cardiovascular:  Negative for chest pain.  Gastrointestinal:  Positive for constipation and diarrhea. Negative for abdominal pain, blood in stool, nausea and vomiting.  Genitourinary:  Negative for dysuria.  Musculoskeletal:  Positive for gait problem.  Skin:  Negative for wound.  Neurological:  Positive for dizziness and weakness. Negative for headaches.  Psychiatric/Behavioral:  Negative for dysphoric mood and sleep disturbance. The patient is nervous/anxious.        OCD    Immunization History  Administered Date(s) Administered   Fluad Trivalent(High Dose 65+) 08/01/2023   Influenza, High Dose Seasonal PF 07/16/2022   Influenza-Unspecified 07/12/2017, 07/26/2018, 09/18/2019, 07/16/2022   Moderna Covid-19 Vaccine Bivalent Booster 43yrs & up 08/01/2023   Moderna Sars-Covid-2 Vaccination 08/16/2022   Pneumococcal Conjugate PCV 7 08/06/2015   Pneumococcal Polysaccharide-23 08/13/2013   Pneumococcal-Unspecified 08/06/2015   Td 12/05/2008   Td (Adult) 12/05/2008   Tdap 11/24/2022    Pertinent  Health Maintenance Due  Topic Date Due   INFLUENZA VACCINE  Completed   Colonoscopy  Discontinued      02/01/2023    1:43 PM 03/28/2023    1:04 PM 04/04/2023   10:25 AM 05/02/2023    1:21 PM 08/02/2023    2:45 PM  Fall Risk  Falls in the past year? 0 0 0 0 0  Was there an injury with Fall? 0 0 0 0 0  Fall Risk Category Calculator 0 0 0 0 0  Patient at Risk for Falls Due to No Fall Risks No Fall Risks History of fall(s) Impaired balance/gait;Impaired mobility Impaired balance/gait;Impaired mobility  Patient at Risk for Falls Due to - Comments   patient is a hight fall risk with no current fall injurys    Fall risk Follow up Falls evaluation completed Falls evaluation completed Falls evaluation completed Falls evaluation completed Falls evaluation completed   Functional Status Survey:    Vitals:   08/03/23 1409  BP: 138/81  Pulse: 83  Resp: 16  Temp: 97.8 F (36.6 C)  SpO2: 98%  Weight: 198 lb 3.2 oz (89.9 kg)  Height: 5\' 10"  (1.778 m)   Body mass index is 28.44 kg/m. Physical Exam Vitals reviewed.  Constitutional:      General: He is not in acute distress. HENT:     Head: Normocephalic.     Right Ear: There is no impacted cerumen.     Left Ear: There is no impacted cerumen.     Nose: Nose normal.     Mouth/Throat:     Mouth: Mucous membranes are moist.  Eyes:     General:        Right eye: No discharge.        Left eye: No discharge.  Cardiovascular:     Rate and Rhythm: Normal rate and regular rhythm.     Pulses: Normal pulses.     Heart sounds: Normal heart sounds.  Pulmonary:     Effort: Pulmonary effort is normal. No respiratory distress.     Breath sounds: Normal breath sounds. No wheezing or rhonchi.  Abdominal:     General: Bowel sounds are normal. There is no distension.     Palpations: Abdomen is soft.     Tenderness: There is no abdominal tenderness. There is no guarding or rebound.  Musculoskeletal:     Cervical back: Neck supple.      Right lower leg: No edema.     Left lower leg: No edema.     Comments: Able to move all extremities, BLE strength 5/5  Skin:    General: Skin is warm.     Capillary Refill: Capillary refill takes less than 2 seconds.  Neurological:     General: No focal deficit present.     Mental Status: He is alert and oriented to person, place, and time.     Gait: Gait abnormal.     Comments: walker  Psychiatric:        Mood and Affect: Mood normal.     Labs reviewed: Recent Labs    11/08/22 2013 11/23/22 0000 03/21/23 1339  NA 138 136* 134*  K 4.5 4.3 4.0  CL 100 96* 98  CO2 23* 26* 25  GLUCOSE  --   --  120*  BUN 17 19 11   CREATININE 1.2 1.2 1.05  CALCIUM 9.1 8.8 8.9   Recent Labs    11/23/22 0000  AST 19  ALT 8*  ALKPHOS 71  ALBUMIN 4.4   Recent Labs    12/06/22 0000 12/28/22 0000 03/21/23 1339  WBC 4.8 4.9 6.0  HGB 10.0* 12.3* 13.0  HCT 31* 39* 40.9  MCV  --   --  88.0  PLT 163 156 156   Lab Results  Component Value Date   TSH 1.60 12/06/2022   No results found for: "HGBA1C" Lab Results  Component Value Date   CHOL 268 (A) 11/23/2022   HDL 51 11/23/2022   LDLCALC 192 11/23/2022   TRIG 127 11/23/2022   CHOLHDL 3.8 02/03/2022    Significant Diagnostic Results in last 30 days:  No results found.  Assessment/Plan 1. Dizziness - 1 episode this morning - orthostatics unremarkable - vitals stable - hgb 13.1 07/28/2023 - repeat cbc/diff, cmp  2. Weakness - exam unremarkable - received flu/covid 10/22> ? Fatigue - ambulated well in room - consider PT/OT consult if mobility worsens  3. Obsessive-compulsive disorder, unspecified type - ongoing - constantly concerned about bowels/abdomen due to past abdominal issues/ hernia surgery - 12/17 scheduled to see [facility] -  cont ativan, Remeron, Zoloft and Risperdal - consider seeing in house provider if behaviors worsen prior to scheduled appointment    4. IBS with constipation -  abdomen soft, normal BS x 4 - advised to stop drinking orange juice - encourage hydration with water - discussed well balanced diet  - cont Amitiza, miralax     Family/ staff Communication: plan discussed with patient and nurse  Labs/tests ordered:  cbc/diff, cmp

## 2023-09-29 ENCOUNTER — Other Ambulatory Visit: Payer: Self-pay | Admitting: Adult Health

## 2023-09-29 DIAGNOSIS — F419 Anxiety disorder, unspecified: Secondary | ICD-10-CM

## 2023-09-29 MED ORDER — LORAZEPAM 0.5 MG PO TABS
0.5000 mg | ORAL_TABLET | Freq: Three times a day (TID) | ORAL | 0 refills | Status: DC | PRN
Start: 2023-09-29 — End: 2023-10-03

## 2023-10-03 ENCOUNTER — Other Ambulatory Visit: Payer: Self-pay | Admitting: Internal Medicine

## 2023-10-03 DIAGNOSIS — F419 Anxiety disorder, unspecified: Secondary | ICD-10-CM

## 2023-10-03 MED ORDER — LORAZEPAM 0.5 MG PO TABS
0.5000 mg | ORAL_TABLET | Freq: Three times a day (TID) | ORAL | 0 refills | Status: DC | PRN
Start: 2023-10-03 — End: 2023-11-28

## 2023-11-03 LAB — LIPID PANEL
Cholesterol: 229 — AB (ref 0–200)
HDL: 41 (ref 35–70)
LDL Cholesterol: 164
LDl/HDL Ratio: 5.5
Triglycerides: 117 (ref 40–160)

## 2023-11-07 ENCOUNTER — Encounter: Payer: Self-pay | Admitting: Adult Health

## 2023-11-07 ENCOUNTER — Non-Acute Institutional Stay: Payer: Medicare Other | Admitting: Adult Health

## 2023-11-07 VITALS — BP 130/82 | HR 94 | Temp 97.8°F | Resp 18 | Ht 70.0 in | Wt 194.0 lb

## 2023-11-07 DIAGNOSIS — D509 Iron deficiency anemia, unspecified: Secondary | ICD-10-CM | POA: Diagnosis not present

## 2023-11-07 DIAGNOSIS — K5901 Slow transit constipation: Secondary | ICD-10-CM

## 2023-11-07 DIAGNOSIS — F419 Anxiety disorder, unspecified: Secondary | ICD-10-CM

## 2023-11-07 DIAGNOSIS — D696 Thrombocytopenia, unspecified: Secondary | ICD-10-CM

## 2023-11-07 DIAGNOSIS — F32A Depression, unspecified: Secondary | ICD-10-CM

## 2023-11-07 DIAGNOSIS — E782 Mixed hyperlipidemia: Secondary | ICD-10-CM | POA: Diagnosis not present

## 2023-11-07 MED ORDER — SIMVASTATIN 20 MG PO TABS: 20.0000 mg | ORAL_TABLET | Freq: Every day | ORAL | 3 refills | Status: DC

## 2023-11-07 NOTE — Progress Notes (Addendum)
Location:  [facility]  POS: clinic  Provider:  Peggye Ley, ANP [facility] [phone]   Code Status: Full Goals of Care:     11/07/2023    3:02 PM  Advanced Directives  Does Patient Have a Medical Advance Directive? Yes  Type of Estate agent of [place];Living will  Does patient want to make changes to medical advance directive? No - Patient declined  Copy of Healthcare Power of Attorney in Chart? Yes - validated most recent copy scanned in chart (See row information)     Chief Complaint  Patient presents with   Medical Management of Chronic Issues    3 month follow up.     HPI: Patient is a 78 y.o. male seen today for medical management of chronic diseases.    Discussed the use of AI scribe software for clinical note transcription with the patient, who gave verbal consent to proceed.  History of Present Illness   The 78 year old patient, residing in an assisted living facility, presents with concerns about recent lab work. They report a history of anemia, for which they are taking iron supplements three times a week. They also have a slightly low platelet count of 127,000 (normal range above 150,000). The patient is on Remeron, Risperdal, and Sertraline for mood management and reports a generally good mood with no significant depressive symptoms. They report a fair appetite and good sleep patterns.  The patient has been experiencing ongoing constipation, which they describe as a 'forced' bowel movement and not a normal stool. They are currently taking medication for constipation twice a day. They also report that their cholesterol levels have been slightly elevated, with an LDL of 136 in July and 164 in January. The patient is considering starting cholesterol medication, specifically a statin, to manage this.  The patient's kidney function appears normal, with a creatinine level of 1.3 in October. They have not had any previous heart  attacks or strokes. The patient's diet is not strictly monitored, and they express some reluctance to make significant dietary changes. They are ambulatory, using a walker for mobility.      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 Recommended repeat in 3 years.   Continues to have an unsteady gait and uses a walker.  Past Medical History:  Diagnosis Date   Anemia    Anxiety    Per [facility] new patient packet   Depression    High blood pressure    Per   High cholesterol    Per [facility] new patient packet   IBS (irritable bowel syndrome)    Per [facility] new patient packet   OCD (obsessive compulsive disorder)    Per [facility] new patient packet   Prostate cancer (HCC)    Per [facility] new patient packet   Sleep apnea     Past Surgical History:  Procedure Laterality Date   COLONOSCOPY     TONSILECTOMY, ADENOIDECTOMY, BILATERAL MYRINGOTOMY AND TUBES     Per [facility] new patient packet   XI ROBOTIC ASSISTED INGUINAL HERNIA REPAIR WITH MESH Bilateral 04/01/2023   Procedure: XI ROBOTIC REPAIR RIGHT INGUINAL HERNIA REPAIR WITH MESH, XI ROOBOTIC REPAIR INCARCERATED LEFT INGUINAL HERNIA WITH MESH, LAPAROSCOPIC TAP BLOCK;  Surgeon: Gaynelle Adu, MD;  Location: [facility];  Service: General;  Laterality: Bilateral;    Allergies  Allergen Reactions   Nsaids Other (See Comments)    Kidney disease per Patient    Outpatient Encounter Medications as of 11/07/2023  Medication Sig  Carboxymethylcellulose Sodium (ARTIFICIAL TEARS OP) Place 2 drops into both eyes 2 (two) times daily.   DEXTRAN 70-HYPROMELLOSE OP Apply 2 drops to eye in the morning and at bedtime.   ferrous sulfate 325 (65 FE) MG EC tablet Take 325 mg by mouth every Monday, Wednesday, and Friday.   LORazepam (ATIVAN) 0.5 MG tablet Take 1 tablet (0.5 mg total) by mouth 3 (three) times daily as needed for anxiety. (Patient taking differently: Take 0.5 mg by mouth daily. 00.5 mg, oral, once a morning, lorazepam 0.5 mg 1 tab for anxiety)   mirtazapine  (REMERON) 15 MG tablet Take 15 mg by mouth at bedtime.   pantoprazole (PROTONIX) 40 MG tablet Take 40 mg by mouth in the morning.   polyethylene glycol (MIRALAX / GLYCOLAX) 17 g packet Take 17 g by mouth 2 (two) times daily.   Probiotic Product (PROBIOTIC GUMMIES PO) Take 2 each by mouth in the morning.   risperiDONE (RISPERDAL) 3 MG tablet Take 1 tablet (3 mg total) by mouth at bedtime.   sertraline (ZOLOFT) 100 MG tablet Take 200 mg by mouth at bedtime.   tamsulosin (FLOMAX) 0.4 MG CAPS capsule Take 0.8 mg by mouth every evening.   bisacodyl (DULCOLAX) 10 MG suppository Place 1 suppository (10 mg total) rectally as needed for moderate constipation. (Patient not taking: Reported on 11/07/2023)   lubiprostone (AMITIZA) 24 MCG capsule Take 24 mcg by mouth 2 (two) times daily as needed for constipation.   sennosides-docusate sodium (SENOKOT-S) 8.6-50 MG tablet Take 2 tablets by mouth 2 (two) times daily as needed. Hold for loose stools (Patient not taking: Reported on 11/07/2023)   simethicone (MYLICON) 125 MG chewable tablet Chew 125 mg by mouth 2 (two) times daily as needed (gas pain.). (Patient not taking: Reported on 11/07/2023)   No facility-administered encounter medications on file as of 11/07/2023.    Review of Systems:  Review of Systems  Constitutional:  Negative for activity change, appetite change, chills, diaphoresis, fatigue, fever and unexpected weight change.  Respiratory:  Negative for cough, shortness of breath, wheezing and stridor.   Cardiovascular:  Positive for leg swelling. Negative for chest pain and palpitations.  Gastrointestinal:  Positive for constipation. Negative for abdominal distention, abdominal pain and diarrhea.  Genitourinary:  Negative for difficulty urinating and dysuria.  Musculoskeletal:  Positive for gait problem. Negative for arthralgias, back pain, joint swelling and myalgias.  Neurological:  Negative for dizziness, seizures, syncope, facial asymmetry,  speech difficulty, weakness and headaches.  Hematological:  Negative for adenopathy. Does not bruise/bleed easily.  Psychiatric/Behavioral:  Positive for dysphoric mood. Negative for agitation, behavioral problems and confusion.     Health Maintenance  Topic Date Due   COVID-19 Vaccine (3 - Moderna risk series) 11/23/2023 (Originally 08/30/2023)   Zoster Vaccines- Shingrix (1 of 2) 02/05/2024 (Originally 06/19/1965)   Hepatitis C Screening  03/27/2024 (Originally 06/19/1964)   Medicare Annual Wellness (AWV)  03/27/2024   DTaP/Tdap/Td (4 - Td or Tdap) 11/24/2032   INFLUENZA VACCINE  Completed   HPV VACCINES  Aged Out   Pneumonia Vaccine 68+ Years old  Discontinued   Colonoscopy  Discontinued    Physical Exam: Vitals:   11/07/23 1459  BP: 130/82  Pulse: 94  Resp: 18  Temp: 97.8 F (36.6 C)  TempSrc: Temporal  SpO2: 99%  Weight: 194 lb (88 kg)  Height: 5\' 10"  (1.778 m)   Body mass index is 27.84 kg/m. Wt Readings from Last 3 Encounters:  11/07/23 194 lb (88 kg)  08/03/23 198 lb 3.2 oz (89.9 kg)  08/02/23 198 lb 3.2 oz (89.9 kg)    Physical Exam Vitals and nursing note reviewed.  Constitutional:      General: He is not in acute distress.    Appearance: He is not diaphoretic.  HENT:     Head: Normocephalic and atraumatic.  Neck:     Thyroid: No thyromegaly.     Vascular: No JVD.     Trachea: No tracheal deviation.  Cardiovascular:     Rate and Rhythm: Normal rate and regular rhythm.     Heart sounds: No murmur heard. Pulmonary:     Effort: Pulmonary effort is normal. No respiratory distress.     Breath sounds: Normal breath sounds. No wheezing.  Abdominal:     General: Bowel sounds are normal. There is no distension.     Palpations: Abdomen is soft.     Tenderness: There is no abdominal tenderness.  Lymphadenopathy:     Cervical: No cervical adenopathy.  Skin:    General: Skin is warm and dry.  Neurological:     Mental Status: He is alert and oriented to person,  place, and time.     Cranial Nerves: No cranial nerve deficit.     Labs reviewed: Basic Metabolic Panel: Recent Labs    11/23/22 0000 12/06/22 0000 03/21/23 1339 08/03/23 0000  NA 136*  --  134* 136*  K 4.3  --  4.0 4.4  CL 96*  --  98 101  CO2 26*  --  25 20  GLUCOSE  --   --  120*  --   BUN 19  --  11 15  CREATININE 1.2  --  1.05 1.3  CALCIUM 8.8  --  8.9 8.8  TSH  --  1.60  --   --    Liver Function Tests: Recent Labs    11/23/22 0000 08/03/23 0000  AST 19 15  ALT 8* 10  ALKPHOS 71 66  ALBUMIN 4.4 4.5   No results for input(s): "LIPASE", "AMYLASE" in the last 8760 hours. No results for input(s): "AMMONIA" in the last 8760 hours. CBC: Recent Labs    12/28/22 0000 03/21/23 1339 08/03/23 0000  WBC 4.9 6.0 6.1  HGB 12.3* 13.0 13.2*  HCT 39* 40.9 39*  MCV  --  88.0  --   PLT 156 156 124*   Lipid Panel: Recent Labs    11/23/22 0000  CHOL 268*  HDL 51  LDLCALC 192  TRIG 469   No results found for: "HGBA1C"  Procedures since last visit: No results found.  Assessment/Plan  Assessment and Plan    Hyperlipidemia LDL cholesterol elevated at 164 (previously 136). Patient is on Risperdal which can affect cholesterol levels. No history of heart attack or stroke. Discussed dietary modifications and the option of starting a statin medication. -Start Simvastatin (Zocor) at a low dose. -Repeat lipid panel in 6 weeks.  Anemia Hemoglobin was 13.2 in October, and platelet count was slightly low at 127,000 (normal >150,000). Patient is on iron supplementation three times a week. -Repeat complete blood count to monitor hemoglobin and platelet count.  Constipation Patient reports constipation despite taking medication for it twice daily. Also on iron supplementation which can cause constipation. -Continue current regimen and monitor.  Depression Patient is on Remeron, Risperdal, and Sertraline. Reports mood as "pretty good" and denies feeling  depressed. -Continue current regimen and monitor.  Follow-up -Next appointment scheduled with labs to be drawn prior to the  visit.      Followed by Dr Betti Cruz with psychiatry for anxiety, depression, OCD If his platelets continues to drop can reach out to Dr Betti Cruz for consideration of med dose reduction (possible s/e from Risperdal)    Labs/tests ordered:  * No order type specified * CBC Lipid panel BMP in 6 weeks  F/U 6 weeks     Total time :  time greater than 50% of total time spent doing pt counseling and coordination of care

## 2023-11-28 ENCOUNTER — Other Ambulatory Visit: Payer: Self-pay | Admitting: Internal Medicine

## 2023-11-28 DIAGNOSIS — F419 Anxiety disorder, unspecified: Secondary | ICD-10-CM

## 2023-11-28 MED ORDER — LORAZEPAM 0.5 MG PO TABS
0.5000 mg | ORAL_TABLET | Freq: Three times a day (TID) | ORAL | 0 refills | Status: DC | PRN
Start: 2023-11-28 — End: 2024-01-02

## 2023-12-15 LAB — CBC: RBC: 4.33 (ref 3.87–5.11)

## 2023-12-15 LAB — CBC AND DIFFERENTIAL
HCT: 38 — AB (ref 41–53)
Hemoglobin: 12.7 — AB (ref 13.5–17.5)
Platelets: 113 10*3/uL — AB (ref 150–400)
WBC: 4.5

## 2023-12-15 LAB — HEPATIC FUNCTION PANEL
ALT: 9 U/L — AB (ref 10–40)
AST: 14 (ref 14–40)
Alkaline Phosphatase: 54 (ref 25–125)
Bilirubin, Total: 0.2

## 2023-12-15 LAB — BASIC METABOLIC PANEL
BUN: 13 (ref 4–21)
CO2: 27 — AB (ref 13–22)
Chloride: 100 (ref 99–108)
Creatinine: 1.1 (ref 0.6–1.3)
Glucose: 81
Potassium: 4.5 meq/L (ref 3.5–5.1)
Sodium: 135 — AB (ref 137–147)

## 2023-12-15 LAB — COMPREHENSIVE METABOLIC PANEL
Albumin: 3.9 (ref 3.5–5.0)
Calcium: 8.7 (ref 8.7–10.7)
eGFR: 71

## 2023-12-16 ENCOUNTER — Encounter: Payer: Self-pay | Admitting: Adult Health

## 2023-12-19 ENCOUNTER — Encounter: Payer: Self-pay | Admitting: Adult Health

## 2023-12-19 ENCOUNTER — Non-Acute Institutional Stay: Payer: Medicare Other | Admitting: Adult Health

## 2023-12-19 VITALS — BP 110/60 | HR 87 | Temp 98.0°F | Resp 20 | Ht 70.0 in | Wt 190.0 lb

## 2023-12-19 DIAGNOSIS — F429 Obsessive-compulsive disorder, unspecified: Secondary | ICD-10-CM

## 2023-12-19 DIAGNOSIS — E782 Mixed hyperlipidemia: Secondary | ICD-10-CM | POA: Diagnosis not present

## 2023-12-19 MED ORDER — RISPERIDONE 2 MG PO TABS: 3.0000 mg | ORAL_TABLET | Freq: Every day | ORAL | Status: DC

## 2023-12-19 MED ORDER — RISPERIDONE 2 MG PO TABS: 2.0000 mg | ORAL_TABLET | Freq: Every day | ORAL | Status: DC

## 2023-12-19 NOTE — Progress Notes (Signed)
 Location:  [facility]  POS: Clinic  Provider: Fletcher Anon, ANP  Code Status: Full Goals of Care:     12/19/2023    1:53 PM  Advanced Directives  Does Patient Have a Medical Advance Directive? Yes  Type of Estate agent of [place];Living will  Does patient want to make changes to medical advance directive? No - Patient declined  Copy of Healthcare Power of Attorney in Chart? Yes - validated most recent copy scanned in chart (See row information)     Chief Complaint  Patient presents with   Medical Management of Chronic Issues    6 week follow up.    HPI He presents for follow-up to monitor platelet count and hyperlipidemia.  He is being monitored for a decline in platelet count. On December 15, 2023, his platelet count was 113,000, which is a decrease from previous counts of 124,000 in October 2024, 156,000 in June 2024, and 187,000 a year prior. No epistaxis, hematuria, melena, or unusual bleeding. He experiences fatigue and gait instability, requiring the use of a walker. He also gets tired and needs to stop and rest while walking.  His hyperlipidemia is being managed with Zocor, and he has no muscle pain associated with its use. His LDL cholesterol has decreased from 192 a year ago to 112 on December 15, 2023, indicating a positive response to the medication.  He is currently on several medications including Zoloft, Risperdal, Ativan, Remeron, and Zocor. Risperdal is noted to potentially affect platelet count, and he takes it at 3 mg at bedtime.  He has a history of depression and anxiety, which are managed with his current medication regimen. He reports that his mental health is in 'pretty good shape' but acknowledges ongoing issues with depression and anxiety.  Past Medical History:  Diagnosis Date   Anemia    Anxiety    Per [facility] new patient packet   Depression    High blood pressure    Per   High cholesterol    Per [facility] new patient packet   IBS  (irritable bowel syndrome)    Per [facility] new patient packet   OCD (obsessive compulsive disorder)    Per [facility] new patient packet   Prostate cancer (HCC)    Per [facility] new patient packet   Sleep apnea     Past Surgical History:  Procedure Laterality Date   COLONOSCOPY     TONSILECTOMY, ADENOIDECTOMY, BILATERAL MYRINGOTOMY AND TUBES     Per [facility] new patient packet   XI ROBOTIC ASSISTED INGUINAL HERNIA REPAIR WITH MESH Bilateral 04/01/2023   Procedure: XI ROBOTIC REPAIR RIGHT INGUINAL HERNIA REPAIR WITH MESH, XI ROOBOTIC REPAIR INCARCERATED LEFT INGUINAL HERNIA WITH MESH, LAPAROSCOPIC TAP BLOCK;  Surgeon: Gaynelle Adu, MD;  Location: [facility];  Service: General;  Laterality: Bilateral;    Allergies  Allergen Reactions   Nsaids Other (See Comments)    Kidney disease per Patient    Outpatient Encounter Medications as of 12/19/2023  Medication Sig   bisacodyl (DULCOLAX) 10 MG suppository Place 1 suppository (10 mg total) rectally as needed for moderate constipation.   Carboxymethylcellulose Sodium (ARTIFICIAL TEARS OP) Place 2 drops into both eyes 2 (two) times daily.   DEXTRAN 70-HYPROMELLOSE OP Apply 2 drops to eye in the morning and at bedtime.   ferrous sulfate 325 (65 FE) MG EC tablet Take 325 mg by mouth every Monday, Wednesday, and Friday.   LORazepam (ATIVAN) 0.5 MG tablet Take 1 tablet (0.5 mg total) by  mouth 3 (three) times daily as needed for anxiety.   lubiprostone (AMITIZA) 24 MCG capsule Take 24 mcg by mouth 2 (two) times daily as needed for constipation.   mirtazapine (REMERON) 15 MG tablet Take 15 mg by mouth at bedtime.   pantoprazole (PROTONIX) 40 MG tablet Take 40 mg by mouth in the morning.   polyethylene glycol (MIRALAX / GLYCOLAX) 17 g packet Take 17 g by mouth 2 (two) times daily.   Probiotic Product (PROBIOTIC GUMMIES PO) Take 2 each by mouth in the morning.   risperiDONE (RISPERDAL) 3 MG tablet Take 1 tablet (3 mg total) by mouth at bedtime.   sennosides-docusate sodium  (SENOKOT-S) 8.6-50 MG tablet Take 2 tablets by mouth 2 (two) times daily as needed. Hold for loose stools   sertraline (ZOLOFT) 100 MG tablet Take 200 mg by mouth at bedtime.   simethicone (MYLICON) 125 MG chewable tablet Chew 125 mg by mouth 2 (two) times daily as needed (gas pain.).   simvastatin (ZOCOR) 20 MG tablet Take 1 tablet (20 mg total) by mouth at bedtime.   tamsulosin (FLOMAX) 0.4 MG CAPS capsule Take 0.8 mg by mouth every evening.   No facility-administered encounter medications on file as of 12/19/2023.    Review of Systems:  Review of Systems  Constitutional:  Negative for activity change, appetite change, chills, diaphoresis, fatigue, fever and unexpected weight change.  Respiratory:  Negative for cough, shortness of breath, wheezing and stridor.   Cardiovascular:  Negative for chest pain, palpitations and leg swelling.  Gastrointestinal:  Negative for abdominal distention, abdominal pain, constipation and diarrhea.  Genitourinary:  Negative for difficulty urinating and dysuria.  Musculoskeletal:  Positive for gait problem. Negative for arthralgias, back pain, joint swelling and myalgias.  Neurological:  Negative for dizziness, seizures, syncope, facial asymmetry, speech difficulty, weakness and headaches.  Hematological:  Negative for adenopathy. Does not bruise/bleed easily.  Psychiatric/Behavioral:  Positive for dysphoric mood. Negative for agitation, behavioral problems and confusion.     Health Maintenance  Topic Date Due   COVID-19 Vaccine (3 - Moderna risk series) 01/04/2024 (Originally 08/30/2023)   Zoster Vaccines- Shingrix (1 of 2) 02/05/2024 (Originally 06/19/1965)   Hepatitis C Screening  03/27/2024 (Originally 06/19/1964)   Medicare Annual Wellness (AWV)  03/27/2024   DTaP/Tdap/Td (4 - Td or Tdap) 11/24/2032   INFLUENZA VACCINE  Completed   HPV VACCINES  Aged Out   Pneumonia Vaccine 62+ Years old  Discontinued   Colonoscopy  Discontinued    Physical  Exam: Vitals:   12/16/23 1645  BP: 110/60  Pulse: 87  Resp: 20  Temp: 98 F (36.7 C)  SpO2: 98%  Weight: 190 lb (86.2 kg)  Height: 5\' 10"  (1.778 m)   Body mass index is 27.26 kg/m. Physical Exam Vitals and nursing note reviewed.  Constitutional:      General: He is not in acute distress.    Appearance: He is not diaphoretic.  HENT:     Head: Normocephalic and atraumatic.  Neck:     Thyroid: No thyromegaly.     Vascular: No JVD.     Trachea: No tracheal deviation.  Cardiovascular:     Rate and Rhythm: Normal rate and regular rhythm.     Heart sounds: No murmur heard. Pulmonary:     Effort: Pulmonary effort is normal. No respiratory distress.     Breath sounds: Normal breath sounds. No wheezing.  Abdominal:     General: Bowel sounds are normal. There is no distension.  Palpations: Abdomen is soft.     Tenderness: There is no abdominal tenderness.  Musculoskeletal:     Cervical back: No rigidity or tenderness.  Lymphadenopathy:     Cervical: No cervical adenopathy.  Skin:    General: Skin is warm and dry.  Neurological:     Mental Status: He is alert and oriented to person, place, and time.  Psychiatric:        Mood and Affect: Mood normal.     Comments: Flat affect     Labs reviewed: Basic Metabolic Panel: Recent Labs    03/21/23 1339 08/03/23 0000  NA 134* 136*  K 4.0 4.4  CL 98 101  CO2 25 20  GLUCOSE 120*  --   BUN 11 15  CREATININE 1.05 1.3  CALCIUM 8.9 8.8   Liver Function Tests: Recent Labs    08/03/23 0000  AST 15  ALT 10  ALKPHOS 66  ALBUMIN 4.5   No results for input(s): "LIPASE", "AMYLASE" in the last 8760 hours. No results for input(s): "AMMONIA" in the last 8760 hours. CBC: Recent Labs    12/28/22 0000 03/21/23 1339 08/03/23 0000  WBC 4.9 6.0 6.1  HGB 12.3* 13.0 13.2*  HCT 39* 40.9 39*  MCV  --  88.0  --   PLT 156 156 124*   Lipid Panel: Recent Labs    11/03/23 0000  CHOL 229*  HDL 41  LDLCALC 164  TRIG 962    No results found for: "HGBA1C"  Procedures since last visit: No results found.  Assessment/Plan  Thrombocytopenia Gradual decline in platelet count over the past year, currently at 113,000. No signs of bleeding or bruising. Possible medication-induced thrombocytopenia due to Risperdal. -Reduce Risperdal from 3mg  to 2mg  daily. -Repeat CBC in 6 weeks. -Consider referral to a hematologist if platelet count continues to decline.  Hyperlipidemia LDL improved to 112 from 164 in January 2025 and 192 a year ago. No reported muscle pain from Zocor. -Continue Zocor 20mg  daily.  Gait instability Ongoing issue, possibly related to psychiatric medications. -Continue to monitor and balance mental health needs with physical health.  Depression and Anxiety Managed with Zoloft, Risperdal, Ativan, and Remeron. -Continue current regimen, with adjustment to Risperdal as noted above. -Communicate with psychiatrist regarding change in Risperdal dose.  Labs/tests ordered:  * No order type specified * CBC with peripheral smear prior to apt Next appt:  6 weeks f/u with Dr Chales Abrahams   Total time :  time greater than 50% of total time spent doing pt counseling and coordination of care

## 2024-01-02 ENCOUNTER — Other Ambulatory Visit: Payer: Self-pay | Admitting: Adult Health

## 2024-01-02 DIAGNOSIS — F419 Anxiety disorder, unspecified: Secondary | ICD-10-CM

## 2024-01-02 MED ORDER — LORAZEPAM 0.5 MG PO TABS: 0.5000 mg | ORAL_TABLET | Freq: Three times a day (TID) | ORAL | 1 refills | Status: DC | PRN

## 2024-01-26 LAB — CBC AND DIFFERENTIAL
HCT: 36 — AB (ref 41–53)
Hemoglobin: 12.1 — AB (ref 13.5–17.5)
Platelets: 128 10*3/uL — AB (ref 150–400)
WBC: 4.5

## 2024-01-26 LAB — CBC: RBC: 4.05 (ref 3.87–5.11)

## 2024-01-31 ENCOUNTER — Non-Acute Institutional Stay: Admitting: Internal Medicine

## 2024-01-31 ENCOUNTER — Encounter: Payer: Self-pay | Admitting: Internal Medicine

## 2024-01-31 VITALS — BP 116/80 | HR 85 | Temp 98.1°F | Resp 21 | Ht 70.0 in | Wt 192.6 lb

## 2024-01-31 DIAGNOSIS — K5901 Slow transit constipation: Secondary | ICD-10-CM | POA: Diagnosis not present

## 2024-01-31 DIAGNOSIS — N401 Enlarged prostate with lower urinary tract symptoms: Secondary | ICD-10-CM

## 2024-01-31 DIAGNOSIS — D509 Iron deficiency anemia, unspecified: Secondary | ICD-10-CM

## 2024-01-31 DIAGNOSIS — D696 Thrombocytopenia, unspecified: Secondary | ICD-10-CM

## 2024-01-31 DIAGNOSIS — R3912 Poor urinary stream: Secondary | ICD-10-CM

## 2024-01-31 DIAGNOSIS — E782 Mixed hyperlipidemia: Secondary | ICD-10-CM | POA: Diagnosis not present

## 2024-01-31 DIAGNOSIS — F429 Obsessive-compulsive disorder, unspecified: Secondary | ICD-10-CM

## 2024-02-03 NOTE — Progress Notes (Signed)
 Location:  [facility] features editor of Service:  Clinic (12)  Provider:   Code Status: DNR Goals of Care:     12/19/2023    1:53 PM  Advanced Directives  Does Patient Have a Medical Advance Directive? Yes  Type of Estate agent of [place];Living will  Does patient want to make changes to medical advance directive? No - Patient declined  Copy of Healthcare Power of Attorney in Chart? Yes - validated most recent copy scanned in chart (See row information)     Chief Complaint  Patient presents with   Follow-up    6 week follow up.    HPI: Patient is a 78 y.o. male seen today for medical management of chronic diseases.   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  History of Present Illness   The patient, with a history of hernia and anxiety, reports improved bowel movements, but notes that he is larger and darker due to iron supplementation. He has a bowel movement after lunch and dinner, which is somewhat urgent, but denies diarrhea. He is currently taking Amitiza for constipation related to his previous hernia issue.  The patient also reports that his anxiety is manageable, and he has seen improvement since reducing his Risperdal  dosage from 3mg  to 2mg . He denies any increase in anxiety symptoms since the dosage reduction.  The patient also mentions a tremor, which sometimes affects his writing. He is aware that this could be a side effect of his Risperdal  medication.  The patient lives in a care facility and is largely independent, but  appreciates the assistance provided by the nursing staff. He is able to participate in some activities at the facility and occasionally leaves the facility with his son.       Past Medical History:  Diagnosis Date   Anemia    Anxiety    Per [facility] new patient packet   Depression    High blood pressure    Per   High cholesterol    Per [facility] new [facility] packet   IBS (irritable bowel syndrome)    Per [facility] new patient packet   OCD (obsessive compulsive disorder)    Per [facility] new patient packet   Prostate cancer (HCC)    Per [facility] new patient packet   Sleep apnea     Past Surgical History:  Procedure Laterality Date   COLONOSCOPY     TONSILECTOMY, ADENOIDECTOMY, BILATERAL MYRINGOTOMY AND TUBES     Per [facility] new patient packet   XI ROBOTIC ASSISTED INGUINAL HERNIA REPAIR WITH MESH Bilateral 04/01/2023   Procedure: XI ROBOTIC REPAIR RIGHT INGUINAL HERNIA REPAIR WITH MESH, XI ROOBOTIC REPAIR INCARCERATED LEFT INGUINAL HERNIA WITH MESH, LAPAROSCOPIC TAP BLOCK;  Surgeon: Aldean Hummingbird, MD;  Location: [facility];  Service: General;  Laterality: Bilateral;    Allergies  Allergen Reactions   Nsaids Other (See Comments)    Kidney disease per Patient    Outpatient Encounter Medications as of 01/31/2024  Medication Sig   bisacodyl  (DULCOLAX) 10 MG suppository Place 1 suppository (10 mg total) rectally as needed for  moderate constipation.   Carboxymethylcellulose Sodium (ARTIFICIAL TEARS OP) Place 2 drops into both eyes 2 (two) times daily.   DEXTRAN 70-HYPROMELLOSE OP Apply 2 drops to eye in the morning and at bedtime.   ferrous sulfate 325 (65 FE) MG EC tablet Take 325 mg by mouth every Monday, Wednesday, and Friday.   LORazepam  (ATIVAN ) 0.5 MG tablet Take 1 tablet (0.5 mg total) by mouth 3 (three) times daily as needed for anxiety.   lubiprostone (AMITIZA) 24 MCG capsule Take 24 mcg by mouth 2 (two) times daily as needed for constipation.   mirtazapine (REMERON) 15 MG tablet Take 15 mg by mouth at  bedtime.   pantoprazole (PROTONIX) 40 MG tablet Take 40 mg by mouth in the morning.   polyethylene glycol (MIRALAX  / GLYCOLAX ) 17 g packet Take 17 g by mouth 2 (two) times daily.   Probiotic Product (PROBIOTIC GUMMIES PO) Take 2 each by mouth in the morning.   risperiDONE  (RISPERDAL ) 2 MG tablet Take 1 tablet (2 mg total) by mouth at bedtime.   sennosides-docusate sodium  (SENOKOT-S) 8.6-50 MG tablet Take 2 tablets by mouth 2 (two) times daily as needed. Hold for loose stools   sertraline  (ZOLOFT ) 100 MG tablet Take 200 mg by mouth at bedtime.   simethicone (MYLICON) 125 MG chewable tablet Chew 125 mg by mouth 2 (two) times daily as needed (gas pain.).   simvastatin  (ZOCOR ) 20 MG tablet Take 1 tablet (20 mg total) by mouth at bedtime.   tamsulosin  (FLOMAX ) 0.4 MG CAPS capsule Take 0.8 mg by mouth every evening.   No facility-administered encounter medications on file as of 01/31/2024.    Review of Systems:  Review of Systems  Constitutional:  Negative for activity change, appetite change and unexpected weight change.  HENT: Negative.    Respiratory:  Negative for cough and shortness of breath.   Cardiovascular:  Negative for leg swelling.  Gastrointestinal:  Positive for constipation.  Genitourinary:  Negative for frequency.  Musculoskeletal:  Positive for gait problem. Negative for arthralgias and myalgias.  Skin: Negative.  Negative for rash.  Neurological:  Negative for dizziness and weakness.  Psychiatric/Behavioral:  Positive for confusion and dysphoric mood. Negative for sleep disturbance.   All other systems reviewed and are negative.   Health Maintenance  Topic Date Due   Medicare Annual Wellness (AWV)  03/27/2024   Zoster Vaccines- Shingrix (1 of 2) 02/05/2024 (Originally 06/19/1965)   Hepatitis C Screening  03/27/2024 (Originally 06/19/1964)   COVID-19 Vaccine (3 - Moderna risk series) 08/01/2024 (Originally 08/30/2023)   INFLUENZA VACCINE  05/11/2024   DTaP/Tdap/Td (4 - Td or  Tdap) 11/24/2032   HPV VACCINES  Aged Out   Meningococcal B Vaccine  Aged Out   Pneumonia Vaccine 57+ Years old  Discontinued   Colonoscopy  Discontinued    Physical Exam: Vitals:   01/31/24 1453  BP: 116/80  Pulse: 85  Resp: (!) 21  Temp: 98.1 F (36.7 C)  SpO2: 94%  Weight: 192 lb 9.6 oz (87.4 kg)  Height: 5\' 10"  (1.778 m)   Body mass index is 27.64 kg/m. Physical Exam Vitals reviewed.  Constitutional:      Appearance: Normal appearance.  HENT:     Head: Normocephalic.     Nose: Nose normal.     Mouth/Throat:     Mouth: Mucous membranes are moist.     Pharynx: Oropharynx is clear.  Eyes:     Pupils: Pupils are equal, round, and reactive to light.  Cardiovascular:  Rate and Rhythm: Normal rate and regular rhythm.     Pulses: Normal pulses.     Heart sounds: No murmur heard. Pulmonary:     Effort: Pulmonary effort is normal. No respiratory distress.     Breath sounds: Normal breath sounds. No rales.  Abdominal:     General: Abdomen is flat. Bowel sounds are normal.     Palpations: Abdomen is soft.  Musculoskeletal:        General: No swelling.     Cervical back: Neck supple.  Skin:    General: Skin is warm.  Neurological:     General: No focal deficit present.     Mental Status: He is alert and oriented to person, place, and time.  Psychiatric:        Mood and Affect: Mood normal.        Thought Content: Thought content normal.     Labs reviewed: Basic Metabolic Panel: Recent Labs    03/21/23 1339 08/03/23 0000 12/15/23 0000  NA 134* 136* 135*  K 4.0 4.4 4.5  CL 98 101 100  CO2 25 20 27*  GLUCOSE 120*  --   --   BUN 11 15 13   CREATININE 1.05 1.3 1.1  CALCIUM 8.9 8.8 8.7   Liver Function Tests: Recent Labs    08/03/23 0000 12/15/23 0000  AST 15 14  ALT 10 9*  ALKPHOS 66 54  ALBUMIN 4.5 3.9   No results for input(s): "LIPASE", "AMYLASE" in the last 8760 hours. No results for input(s): "AMMONIA" in the last 8760 hours. CBC: Recent  Labs    03/21/23 1339 08/03/23 0000 12/15/23 0000 01/26/24 0000  WBC 6.0 6.1 4.5 4.5  HGB 13.0 13.2* 12.7* 12.1*  HCT 40.9 39* 38* 36*  MCV 88.0  --   --   --   PLT 156 124* 113* 128*   Lipid Panel: Recent Labs    11/03/23 0000  CHOL 229*  HDL 41  LDLCALC 164  TRIG 409   No results found for: "HGBA1C"  Procedures since last visit: No results found.  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 Current dose is 2 mg, reduced from 3 mg. No significant change in anxiety symptoms with dose reduction. Discussed potential side effects, including tremor and gait issues. Emphasized maintaining the lowest effective dose. Sees Dr Kieran Pellet who has agreed - Continue Risperdal  2 mg. - Monitor tremor and gait.  Obsessive-compulsive disorder, unspecified type Follows with Dr Kieran Pellet On High Dose of Zoloft ,Ativan  PRN, Remeron.  Anxiety Anxiety is manageable. No significant increase in symptoms with Risperdal  reduction. Continues management with Dr. Kieran Pellet. - Continue management with Dr. Kieran Pellet. - Monitor anxiety symptoms.  Constipation  Taking iron three times a week and Amitiza. No significant stomach upset. Discussed potential reduction of iron if bothersome. Amitiza initiated for constipation related to hernia. - Continue iron supplementation three times a week. - Continue Amitiza. -. - Adjust iron or Amitiza dosage if bowel movements become too frequent or loose.  Hernia Hernia has healed. No current issues.  BPH Flomax  Anemia  HLD On Statin Iron deficiency anemia, unspecified iron deficiency anemia type Patient has had Negative colonoscopy in 2023 On Iron supplement now 3/week HGB good Level        Labs/tests ordered:  Labs before next visit CBC,LIpid Next appt:  01/31/2024

## 2024-05-07 ENCOUNTER — Encounter: Payer: Self-pay | Admitting: Adult Health

## 2024-05-07 ENCOUNTER — Non-Acute Institutional Stay: Admitting: Adult Health

## 2024-05-07 VITALS — BP 122/80 | HR 90 | Temp 98.4°F | Ht 70.0 in | Wt 193.0 lb

## 2024-05-07 DIAGNOSIS — D696 Thrombocytopenia, unspecified: Secondary | ICD-10-CM

## 2024-05-07 DIAGNOSIS — E782 Mixed hyperlipidemia: Secondary | ICD-10-CM

## 2024-05-07 DIAGNOSIS — D509 Iron deficiency anemia, unspecified: Secondary | ICD-10-CM

## 2024-05-07 DIAGNOSIS — N401 Enlarged prostate with lower urinary tract symptoms: Secondary | ICD-10-CM

## 2024-05-07 DIAGNOSIS — R2681 Unsteadiness on feet: Secondary | ICD-10-CM | POA: Diagnosis not present

## 2024-05-07 DIAGNOSIS — R3912 Poor urinary stream: Secondary | ICD-10-CM

## 2024-05-07 DIAGNOSIS — F419 Anxiety disorder, unspecified: Secondary | ICD-10-CM | POA: Diagnosis not present

## 2024-05-07 DIAGNOSIS — F32A Depression, unspecified: Secondary | ICD-10-CM

## 2024-05-07 NOTE — Progress Notes (Signed)
 Location:  [facility]  POS: Clinic  Provider: Tawni America, ANP  Code Status: Full code  Goals of Care:     12/19/2023    1:53 PM  Advanced Directives  Does Patient Have a Medical Advance Directive? Yes  Type of Estate agent of [place];Living will  Does patient want to make changes to medical advance directive? No - Patient declined  Copy of Healthcare Power of Attorney in Chart? Yes - validated most recent copy scanned in chart (See row information)     Chief Complaint  Patient presents with   Follow-up    3 month follow up    HPI: Discussed the use of AI scribe software for clinical note transcription with the patient, who gave verbal consent to proceed.  History of Present Illness   Kenneth Cunningham is a 78 year old male who presents for follow-up   He has been on a lower dose of Risperdal  due to concerns about side effects, including tremors and low platelet count. His platelet count was 128,000 in April. Previously, his platelet count varied, with readings of 163,000 and 187,000, but it dropped to 113,000 before rising to 128,000. No recurrence of anxiety or depression since the dose adjustment. Followed by Dr Jess with psych.  He takes iron supplements on Monday, Wednesday, and Friday without any issues such as stomach upset. He manages constipation with Miralax  twice a day, resulting in regular bowel movements after lunch and dinner. He does not use Amitiza or Senokot as needed. He experiences gas but no abdominal pain.  For anxiety, he uses Ativan  once a day at a small dose. He is also on Remeron and Zoloft  at higher doses. His cholesterol levels have been a concern, with an LDL of 164 and total cholesterol of 229 as of November 03, 2023. He was started on Zocor  on November 07, 2023, and his cholesterol had previously decreased from 192 to 164 before the increase in medication.  He reports a problem with his gait and experiences some tremors. He uses  a wheelchair for long distances but walks without a walker in his room, using bars on the wall for support. He notices tremors when performing actions rather than at rest.  He follows up with a urologist annually for prostate issues, and his PSA levels are good. He has not received the shingles vaccine.      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  Anxiety and OCD behaviors Prostate Cancer 6 years ago s/p Radiation therapy Seeing Dr Elisabeth in Urology   H/o Robotic Surgery for Inguinal Hernia with mesh on 04/01/2023  Peripheral blood smear 01/26/24 normocytic hypochromic mild anemia mild thrombocytopenia  Past Medical History:  Diagnosis Date   Anemia    Anxiety    Per [facility] new patient packet   Depression    High blood pressure    Per   High cholesterol    Per [facility] new patient packet   IBS (irritable bowel syndrome)    Per [facility] new patient packet   OCD (obsessive compulsive disorder)    Per [facility] new patient packet   Prostate cancer (HCC)    Per [facility] new patient packet   Sleep apnea     Past Surgical History:  Procedure Laterality Date   COLONOSCOPY     TONSILECTOMY, ADENOIDECTOMY, BILATERAL MYRINGOTOMY AND TUBES     Per [facility] new patient packet  XI ROBOTIC ASSISTED INGUINAL HERNIA REPAIR WITH MESH Bilateral 04/01/2023   Procedure: XI ROBOTIC REPAIR RIGHT INGUINAL HERNIA REPAIR WITH MESH, XI ROOBOTIC REPAIR INCARCERATED LEFT INGUINAL HERNIA WITH MESH, LAPAROSCOPIC TAP BLOCK;  Surgeon: Tanda Locus, MD;  Location: [facility];  Service: General;  Laterality: Bilateral;    Allergies  Allergen Reactions   Nsaids Other (See Comments)    Kidney disease per Patient    Outpatient Encounter Medications as of 05/07/2024  Medication Sig   bisacodyl  (DULCOLAX) 10 MG suppository Place 1 suppository (10 mg total) rectally as needed for moderate constipation.   Carboxymethylcellulose Sodium (ARTIFICIAL TEARS OP)  Place 2 drops into both eyes 2 (two) times daily.   ferrous sulfate 325 (65 FE) MG EC tablet Take 325 mg by mouth every Monday, Wednesday, and Friday.   LORazepam  (ATIVAN ) 0.5 MG tablet Take 1 tablet (0.5 mg total) by mouth 3 (three) times daily as needed for anxiety.   lubiprostone (AMITIZA) 24 MCG capsule Take 24 mcg by mouth 2 (two) times daily as needed for constipation.   mirtazapine (REMERON) 15 MG tablet Take 15 mg by mouth at bedtime.   pantoprazole (PROTONIX) 40 MG tablet Take 40 mg by mouth in the morning.   polyethylene glycol (MIRALAX  / GLYCOLAX ) 17 g packet Take 17 g by mouth 2 (two) times daily.   Probiotic Product (PROBIOTIC GUMMIES PO) Take 2 each by mouth in the morning.   risperiDONE  (RISPERDAL ) 2 MG tablet Take 1 tablet (2 mg total) by mouth at bedtime.   sennosides-docusate sodium  (SENOKOT-S) 8.6-50 MG tablet Take 2 tablets by mouth 2 (two) times daily as needed. Hold for loose stools   sertraline  (ZOLOFT ) 100 MG tablet Take 200 mg by mouth at bedtime.   simethicone (MYLICON) 125 MG chewable tablet Chew 125 mg by mouth 2 (two) times daily as needed (gas pain.).   simvastatin  (ZOCOR ) 20 MG tablet Take 1 tablet (20 mg total) by mouth at bedtime.   tamsulosin  (FLOMAX ) 0.4 MG CAPS capsule Take 0.8 mg by mouth every evening.   DEXTRAN 70-HYPROMELLOSE OP Apply 2 drops to eye in the morning and at bedtime. (Patient not taking: Reported on 05/07/2024)   No facility-administered encounter medications on file as of 05/07/2024.    Review of Systems:  Review of Systems  Constitutional:  Negative for activity change, appetite change, chills, diaphoresis, fatigue and fever.  HENT:  Negative for congestion.   Respiratory:  Negative for cough, shortness of breath and wheezing.   Cardiovascular:  Negative for chest pain and leg swelling.  Gastrointestinal:  Positive for constipation. Negative for abdominal distention, abdominal pain, diarrhea, nausea and vomiting.  Genitourinary:  Negative  for difficulty urinating, dysuria and urgency.  Musculoskeletal:  Positive for gait problem. Negative for back pain, myalgias and neck pain.  Skin:  Negative for rash.  Neurological:  Positive for tremors. Negative for dizziness and weakness.  Psychiatric/Behavioral:  Positive for dysphoric mood. Negative for confusion.     Health Maintenance  Topic Date Due   Hepatitis C Screening  Never done   Zoster Vaccines- Shingrix (1 of 2) Never done   Medicare Annual Wellness (AWV)  03/27/2024   COVID-19 Vaccine (3 - Moderna risk series) 08/01/2024 (Originally 08/30/2023)   INFLUENZA VACCINE  05/11/2024   DTaP/Tdap/Td (4 - Td or Tdap) 11/24/2032   Hepatitis B Vaccines  Aged Out   HPV VACCINES  Aged Out   Meningococcal B Vaccine  Aged Out   Pneumococcal Vaccine: 50+ Years  Discontinued  Colonoscopy  Discontinued    Physical Exam: Vitals:   05/07/24 1415  BP: 122/80  Pulse: 90  Temp: 98.4 F (36.9 C)  SpO2: 98%  Weight: 193 lb (87.5 kg)  Height: 5' 10 (1.778 m)   Body mass index is 27.69 kg/m. Physical Exam Vitals and nursing note reviewed.  Constitutional:      Appearance: Normal appearance.  HENT:     Head: Normocephalic and atraumatic.     Right Ear: Tympanic membrane normal.     Left Ear: Tympanic membrane normal.     Nose: Nose normal.     Mouth/Throat:     Mouth: Mucous membranes are moist.     Pharynx: Oropharynx is clear.  Eyes:     Conjunctiva/sclera: Conjunctivae normal.     Pupils: Pupils are equal, round, and reactive to light.  Cardiovascular:     Rate and Rhythm: Normal rate and regular rhythm.     Heart sounds: No murmur heard. Pulmonary:     Effort: Pulmonary effort is normal. No respiratory distress.     Breath sounds: Normal breath sounds. No wheezing.  Abdominal:     General: Bowel sounds are normal. There is no distension.     Palpations: Abdomen is soft.     Tenderness: There is no abdominal tenderness.  Musculoskeletal:     Cervical back: No  rigidity.     Right lower leg: No edema.     Left lower leg: No edema.  Lymphadenopathy:     Cervical: No cervical adenopathy.  Skin:    General: Skin is warm and dry.  Neurological:     General: No focal deficit present.     Mental Status: He is alert and oriented to person, place, and time. Mental status is at baseline.     Comments: No rigidity Mild tremor with intention Gait slow and steady   Psychiatric:        Mood and Affect: Mood normal.     Labs reviewed: Basic Metabolic Panel: Recent Labs    08/03/23 0000 12/15/23 0000  NA 136* 135*  K 4.4 4.5  CL 101 100  CO2 20 27*  BUN 15 13  CREATININE 1.3 1.1  CALCIUM 8.8 8.7   Liver Function Tests: Recent Labs    08/03/23 0000 12/15/23 0000  AST 15 14  ALT 10 9*  ALKPHOS 66 54  ALBUMIN 4.5 3.9   No results for input(s): LIPASE, AMYLASE in the last 8760 hours. No results for input(s): AMMONIA in the last 8760 hours. CBC: Recent Labs    08/03/23 0000 12/15/23 0000 01/26/24 0000  WBC 6.1 4.5 4.5  HGB 13.2* 12.7* 12.1*  HCT 39* 38* 36*  PLT 124* 113* 128*   Lipid Panel: Recent Labs    11/03/23 0000  CHOL 229*  HDL 41  LDLCALC 164  TRIG 882   No results found for: HGBA1C  Procedures since last visit: No results found.  Assessment/Plan Assessment and Plan    Thrombocytopenia Platelet count decreased to 128,000. Risperdal  dosage reduced due to potential impact on platelet levels. - Recheck blood work to monitor platelet count and hemoglobin levels. - Coordinate with Dr. Jess if platelet levels remain low to consider further Risperdal  dosage reduction.  Anemia Heme negative.  Hemoglobin level at 12.1, slightly below normal. Iron supplements taken without issues. - Continue current iron supplementation regimen.  Hyperlipidemia LDL at 164, total cholesterol at 229, both elevated. On Zocor , previous cholesterol decreased from 192 to 164. Goal  LDL under 100, ideally around 120. - Repeat  cholesterol test to evaluate current levels and Zocor  effectiveness.  Gait Disturbance and Tremor Reports gait disturbance and tremors, unchanged. Slight tremor observed, gait improved since last visit. Risperdal  may contribute, dosage reduced. - Monitor gait and tremor symptoms.  General Health Maintenance Considering Shingrix vaccine, previously declined. Recommended due to potential severity of shingles in older adults. Due for Medicare wellness visit. - Recommend and administer Shingrix vaccine if he agrees. - Schedule Medicare wellness visit if he decides to proceed.          Labs/tests ordered:  * No order type specified * CBC and Lipid panel in am  Next appt:  08/07/2024   Total time :  time greater than 50% of total time spent doing pt counseling and coordination of care

## 2024-05-07 NOTE — Patient Instructions (Addendum)
Recommend shingles vaccine

## 2024-05-08 LAB — LIPID PANEL
Cholesterol: 147 (ref 0–200)
HDL: 42 (ref 35–70)
LDL Cholesterol: 88
LDl/HDL Ratio: 3.5
Triglycerides: 83 (ref 40–160)

## 2024-05-08 LAB — CBC AND DIFFERENTIAL
HCT: 36 — AB (ref 41–53)
Hemoglobin: 11.9 — AB (ref 13.5–17.5)
Platelets: 144 K/uL — AB (ref 150–400)
WBC: 4.1

## 2024-05-08 LAB — CBC: RBC: 4 (ref 3.87–5.11)

## 2024-05-28 ENCOUNTER — Other Ambulatory Visit: Payer: Self-pay | Admitting: Adult Health

## 2024-05-28 ENCOUNTER — Other Ambulatory Visit: Payer: Self-pay | Admitting: Internal Medicine

## 2024-05-28 DIAGNOSIS — F419 Anxiety disorder, unspecified: Secondary | ICD-10-CM

## 2024-05-28 MED ORDER — LORAZEPAM 0.5 MG PO TABS: 0.5000 mg | ORAL_TABLET | Freq: Every day | ORAL | 1 refills | Status: DC

## 2024-06-26 ENCOUNTER — Other Ambulatory Visit: Payer: Self-pay | Admitting: Orthopedic Surgery

## 2024-06-26 DIAGNOSIS — F419 Anxiety disorder, unspecified: Secondary | ICD-10-CM

## 2024-06-26 MED ORDER — LORAZEPAM 0.5 MG PO TABS: 0.5000 mg | ORAL_TABLET | Freq: Every day | ORAL | 1 refills | Status: AC

## 2024-08-07 ENCOUNTER — Encounter: Payer: Self-pay | Admitting: Internal Medicine

## 2024-08-07 ENCOUNTER — Non-Acute Institutional Stay: Admitting: Internal Medicine

## 2024-08-07 VITALS — BP 124/80 | HR 93 | Temp 98.1°F | Ht 70.0 in | Wt 193.0 lb

## 2024-08-07 DIAGNOSIS — E782 Mixed hyperlipidemia: Secondary | ICD-10-CM

## 2024-08-07 DIAGNOSIS — D509 Iron deficiency anemia, unspecified: Secondary | ICD-10-CM

## 2024-08-07 DIAGNOSIS — R3912 Poor urinary stream: Secondary | ICD-10-CM

## 2024-08-07 DIAGNOSIS — N401 Enlarged prostate with lower urinary tract symptoms: Secondary | ICD-10-CM | POA: Diagnosis not present

## 2024-08-07 DIAGNOSIS — K5901 Slow transit constipation: Secondary | ICD-10-CM

## 2024-08-07 DIAGNOSIS — F429 Obsessive-compulsive disorder, unspecified: Secondary | ICD-10-CM | POA: Diagnosis not present

## 2024-08-07 MED ORDER — RISPERIDONE 1 MG PO TABS: 1.0000 mg | ORAL_TABLET | Freq: Every day | ORAL | Status: AC

## 2024-08-07 NOTE — Progress Notes (Signed)
 Location:  [facility] Editor of Service:  Clinic (12)  Provider:   Code Status: Full Code Goals of Care:     12/19/2023    1:53 PM  Advanced Directives  Does Patient Have a Medical Advance Directive? Yes  Type of Estate Agent of [place];Living will  Does patient want to make changes to medical advance directive? No - Patient declined  Copy of Healthcare Power of Attorney in Chart? Yes - validated most recent copy scanned in chart (See row information)     Chief Complaint  Patient presents with   Follow-up    3 month follow up    HPI: Patient is a 78 y.o. male seen today for medical management of chronic diseases.    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  History of Present Illness   Kenneth Cunningham is a 78 year old male who presents for a follow-up visit to manage his medications and assess his overall health.  He takes Miralax  twice daily for bowel regulation, resulting in typically loose stools after his first meal and in the evening. He discontinued Amitiza. Risperdal  is currently at 2 mg for obsessive-compulsive behaviors,  . He is considering reducing the dose to 1 mg due to improved stability.  He uses a walker for mobility, describing his gait as slow but without pain. He can walk to the dining room with the walker. No incontinence or frequent nighttime urination.  His sleep and appetite are good. Cholesterol levels have improved, with total cholesterol at 147 and LDL at 88. He acknowledges insufficient physical activity, primarily walking during the day.      Wt Readings  from Last 3 Encounters:  08/07/24 193 lb (87.5 kg)  05/07/24 193 lb (87.5 kg)  01/31/24 192 lb 9.6 oz (87.4 kg)    Past Medical History:  Diagnosis Date   Anemia    Anxiety    Per [facility] new patient packet   Depression    High blood pressure    Per   High cholesterol    Per [facility] new patient packet   IBS (irritable bowel syndrome)    Per [facility] new patient packet   OCD (obsessive compulsive disorder)    Per [facility] new patient packet   Prostate cancer (HCC)    Per [facility] new patient packet   Sleep apnea     Past Surgical History:  Procedure Laterality Date   COLONOSCOPY     TONSILECTOMY, ADENOIDECTOMY, BILATERAL MYRINGOTOMY AND TUBES     Per [facility] new patient packet   XI ROBOTIC ASSISTED INGUINAL HERNIA REPAIR WITH MESH Bilateral 04/01/2023   Procedure: XI ROBOTIC REPAIR RIGHT INGUINAL HERNIA REPAIR WITH MESH, XI ROOBOTIC REPAIR INCARCERATED LEFT INGUINAL HERNIA WITH MESH, LAPAROSCOPIC TAP BLOCK;  Surgeon: Tanda Locus, MD;  Location: [facility];  Service: General;  Laterality: Bilateral;    Allergies  Allergen Reactions   Nsaids Other (See Comments)    Kidney disease per Patient    Outpatient Encounter Medications as of 08/07/2024  Medication Sig   Carboxymethylcellulose Sodium (ARTIFICIAL TEARS OP) Place 2 drops into both eyes 2 (two) times daily.  ferrous sulfate 325 (65 FE) MG EC tablet Take 325 mg by mouth every Monday, Wednesday, and Friday.   guaiFENesin-dextromethorphan (ROBITUSSIN DM) 100-10 MG/5ML syrup Take 10 mLs by mouth every 6 (six) hours as needed for cough.   LORazepam  (ATIVAN ) 0.5 MG tablet Take 1 tablet (0.5 mg total) by mouth daily.   mirtazapine (REMERON) 15 MG tablet Take 15 mg by mouth at bedtime.   pantoprazole (PROTONIX) 40 MG tablet Take 40 mg by mouth in the morning.   polyethylene glycol (MIRALAX  / GLYCOLAX ) 17 g packet Take 17 g by mouth 2 (two) times daily.   Probiotic Product (PROBIOTIC GUMMIES PO) Take 2 each by mouth in the morning.   risperiDONE   (RISPERDAL ) 2 MG tablet Take 1 tablet (2 mg total) by mouth at bedtime.   sertraline  (ZOLOFT ) 100 MG tablet Take 200 mg by mouth at bedtime.   simvastatin  (ZOCOR ) 20 MG tablet Take 1 tablet (20 mg total) by mouth at bedtime.   tamsulosin  (FLOMAX ) 0.4 MG CAPS capsule Take 0.8 mg by mouth every evening.   bisacodyl  (DULCOLAX) 10 MG suppository Place 1 suppository (10 mg total) rectally as needed for moderate constipation. (Patient not taking: Reported on 08/07/2024)   lubiprostone (AMITIZA) 24 MCG capsule Take 24 mcg by mouth 2 (two) times daily as needed for constipation. (Patient not taking: Reported on 08/07/2024)   sennosides-docusate sodium  (SENOKOT-S) 8.6-50 MG tablet Take 2 tablets by mouth 2 (two) times daily as needed. Hold for loose stools (Patient not taking: Reported on 08/07/2024)   simethicone (MYLICON) 125 MG chewable tablet Chew 125 mg by mouth 2 (two) times daily as needed (gas pain.). (Patient not taking: Reported on 08/07/2024)   No facility-administered encounter medications on file as of 08/07/2024.    Review of Systems:  Review of Systems  Constitutional:  Negative for activity change, appetite change and unexpected weight change.  HENT: Negative.    Respiratory:  Negative for cough and shortness of breath.   Cardiovascular:  Negative for leg swelling.  Gastrointestinal:  Negative for constipation.  Genitourinary:  Negative for frequency.  Musculoskeletal:  Negative for arthralgias, gait problem and myalgias.  Skin: Negative.  Negative for rash.  Neurological:  Negative for dizziness and weakness.  Psychiatric/Behavioral:  Negative for confusion and sleep disturbance.   All other systems reviewed and are negative.   Health Maintenance  Topic Date Due   Hepatitis C Screening  Never done   Zoster Vaccines- Shingrix (1 of 2) Never done   COVID-19 Vaccine (3 - Moderna risk series) 08/30/2023   Medicare Annual Wellness (AWV)  03/27/2024   DTaP/Tdap/Td (4 - Td or Tdap)  11/24/2032   Influenza Vaccine  Completed   Meningococcal B Vaccine  Aged Out   Pneumococcal Vaccine: 50+ Years  Discontinued   Colonoscopy  Discontinued    Physical Exam: Vitals:   08/07/24 1213  BP: 124/80  Pulse: 93  Temp: 98.1 F (36.7 C)  SpO2: 96%  Weight: 193 lb (87.5 kg)  Height: 5' 10 (1.778 m)   Body mass index is 27.69 kg/m. Physical Exam Vitals reviewed.  Constitutional:      Appearance: Normal appearance.  HENT:     Head: Normocephalic.     Right Ear: Tympanic membrane normal.     Left Ear: Tympanic membrane normal.     Nose: Nose normal.     Mouth/Throat:     Mouth: Mucous membranes are moist.     Pharynx: Oropharynx is clear.  Eyes:  Pupils: Pupils are equal, round, and reactive to light.  Cardiovascular:     Rate and Rhythm: Normal rate and regular rhythm.     Pulses: Normal pulses.     Heart sounds: No murmur heard. Pulmonary:     Effort: Pulmonary effort is normal. No respiratory distress.     Breath sounds: Normal breath sounds. No rales.  Abdominal:     General: Abdomen is flat. Bowel sounds are normal.     Palpations: Abdomen is soft.  Musculoskeletal:        General: No swelling.     Cervical back: Neck supple.  Skin:    General: Skin is warm.  Neurological:     General: No focal deficit present.     Mental Status: He is alert and oriented to person, place, and time.     Comments: Resting Tremors present  Psychiatric:        Mood and Affect: Mood normal.        Thought Content: Thought content normal.     Labs reviewed: Basic Metabolic Panel: Recent Labs    12/15/23 0000  NA 135*  K 4.5  CL 100  CO2 27*  BUN 13  CREATININE 1.1  CALCIUM 8.7   Liver Function Tests: Recent Labs    12/15/23 0000  AST 14  ALT 9*  ALKPHOS 54  ALBUMIN 3.9   No results for input(s): LIPASE, AMYLASE in the last 8760 hours. No results for input(s): AMMONIA in the last 8760 hours. CBC: Recent Labs    12/15/23 0000  01/26/24 0000 05/08/24 0000  WBC 4.5 4.5 4.1  HGB 12.7* 12.1* 11.9*  HCT 38* 36* 36*  PLT 113* 128* 144*   Lipid Panel: Recent Labs    11/03/23 0000 05/08/24 0000  CHOL 229* 147  HDL 41 42  LDLCALC 164 88  TRIG 117 83   No results found for: HGBA1C  Procedures since last visit: No results found.  Assessment/Plan Assessment and Plan    Constipation managed with Miralax  Constipation managed with Miralax ,   Obsessive-compulsive behaviors Follows with Dr Jess Obsessive-compulsive behaviors managed with Risperdal  2 mg, currently well-managed in a supportive environment. - Reduce Risperdal  to 1 mg to minimize long-term side effects, with option to increase back to 2 mg if symptoms worsen. - Instruct nurses to monitor for changes in behavior and report concerns.  Impaired gait requiring walker Gait slow but functional with walker assistance. Able to walk to the dining room with the walker.  Tremor Mild tremor present.  Hyperlipidemia, improved on statin Significant improvement in lipid profile with total cholesterol reduced from 229 to 147 and LDL from 164 to 88.  Thrombocytopenia Resolved  Anemia Patient has had Negative colonoscopy in 2023  On Iron  BPH  H/o Prostate Cancer Sees Dr Elisabeth Annually  General Health Maintenance Regular follow-up with urology and vision care. Blood pressure well-controlled. Regular visits with psychiatrist and urologist. - Schedule follow-up appointment in three months with [facility]. - Perform blood work in January.         Labs/tests ordered:  CBC,CMP,TSH,Lipid Panel before next visit Next appt:  Visit date not found

## 2024-08-14 LAB — HEPATIC FUNCTION PANEL
ALT: 16 U/L (ref 10–40)
AST: 22 (ref 14–40)
Alkaline Phosphatase: 64 (ref 25–125)
Bilirubin, Total: 0.2

## 2024-08-14 LAB — LIPID PANEL
Cholesterol: 185 (ref 0–200)
HDL: 41 (ref 35–70)
LDL Cholesterol: 113
LDl/HDL Ratio: 4.5
Triglycerides: 155 (ref 40–160)

## 2024-08-14 LAB — BASIC METABOLIC PANEL WITH GFR
BUN: 13 (ref 4–21)
CO2: 29 — AB (ref 13–22)
Chloride: 98 — AB (ref 99–108)
Creatinine: 1.4 — AB (ref 0.6–1.3)
Glucose: 136
Potassium: 5.3 meq/L — AB (ref 3.5–5.1)
Sodium: 139 (ref 137–147)

## 2024-08-14 LAB — CBC AND DIFFERENTIAL
HCT: 42 (ref 41–53)
Hemoglobin: 13.8 (ref 13.5–17.5)
Platelets: 144 10*3/uL — AB (ref 150–400)
WBC: 5.1

## 2024-08-14 LAB — COMPREHENSIVE METABOLIC PANEL WITH GFR
Albumin: 4.6 (ref 3.5–5.0)
Calcium: 9.4 (ref 8.7–10.7)
Globulin: 2.1
eGFR: 53

## 2024-08-14 LAB — CBC: RBC: 4.75 (ref 3.87–5.11)

## 2024-08-14 LAB — TSH: TSH: 1.72 (ref 0.41–5.90)

## 2024-08-20 ENCOUNTER — Encounter: Payer: Self-pay | Admitting: Adult Health

## 2024-08-21 NOTE — Progress Notes (Signed)
 This encounter was created in error - please disregard.

## 2024-09-03 ENCOUNTER — Encounter: Payer: Self-pay | Admitting: Adult Health

## 2024-09-03 ENCOUNTER — Non-Acute Institutional Stay: Payer: Self-pay | Admitting: Adult Health

## 2024-09-03 ENCOUNTER — Non-Acute Institutional Stay: Admitting: Adult Health

## 2024-09-03 DIAGNOSIS — Z0000 Encounter for general adult medical examination without abnormal findings: Secondary | ICD-10-CM | POA: Diagnosis not present

## 2024-09-03 NOTE — Progress Notes (Signed)
 Chief Complaint  Patient presents with   Medicare Wellness     Subjective:   Kenneth Cunningham is a 78 y.o. male who presents for a Medicare Annual Wellness Visit at [facility].   Allergies (verified) Nsaids   History: Past Medical History:  Diagnosis Date   Anemia    Anxiety    Per [facility] new patient packet   Depression    High blood pressure    Per   High cholesterol    Per [facility] new patient packet   IBS (irritable bowel syndrome)    Per [facility] new patient packet   OCD (obsessive compulsive disorder)    Per [facility] new patient packet   Prostate cancer (HCC)    Per [facility] new patient packet   Sleep apnea    Past Surgical History:  Procedure Laterality Date   COLONOSCOPY     TONSILECTOMY, ADENOIDECTOMY, BILATERAL MYRINGOTOMY AND TUBES     Per [facility] new patient packet   XI ROBOTIC ASSISTED INGUINAL HERNIA REPAIR WITH MESH Bilateral 04/01/2023   Procedure: XI ROBOTIC REPAIR RIGHT INGUINAL HERNIA REPAIR WITH MESH, XI ROOBOTIC REPAIR INCARCERATED LEFT INGUINAL HERNIA WITH MESH, LAPAROSCOPIC TAP BLOCK;  Surgeon: Tanda Locus, MD;  Location: [facility];  Service: General;  Laterality: Bilateral;   Family History  Problem Relation Age of Onset   Dementia Mother    Social History   Occupational History   Not on file  Tobacco Use   Smoking status: Never   Smokeless tobacco: Never  Vaping Use   Vaping status: Never Used  Substance and Sexual Activity   Alcohol use: Never   Drug use: Never   Sexual activity: Not on file   Tobacco Counseling Counseling given: Not Answered  SDOH Screenings   Alcohol Screen: Low Risk  (03/28/2023)  Depression (PHQ2-9): Low Risk  (09/03/2024)  Tobacco Use: Low Risk  (08/07/2024)   See flowsheets for full screening details  Depression Screen PHQ 2 & 9 Depression Scale- Over the past 2 weeks, how often have you been bothered by any of the following problems? Little interest or pleasure in doing things: 0 Feeling down,  depressed, or hopeless (PHQ Adolescent also includes...irritable): 2 PHQ-2 Total Score: 2 Trouble falling or staying asleep, or sleeping too much: 0 Feeling tired or having little energy: 1 Poor appetite or overeating (PHQ Adolescent also includes...weight loss): 0 Feeling bad about yourself - or that you are a failure or have let yourself or your family down: 0 Trouble concentrating on things, such as reading the newspaper or watching television (PHQ Adolescent also includes...like school work): 0 Moving or speaking so slowly that other people could have noticed. Or the opposite - being so fidgety or restless that you have been moving around a lot more than usual: 0 Thoughts that you would be better off dead, or of hurting yourself in some way: 0 PHQ-9 Total Score: 3 If you checked off any problems, how difficult have these problems made it for you to do your work, take care of things at home, or get along with other people?: Not difficult at all     Goals Addressed             This Visit's Progress    Patient Stated       Maintain current physical status, proper nutrition exercise.        Visit info / Clinical Intake: Medicare Wellness Visit Type:: Subsequent Annual Wellness Visit Persons participating in visit:: patient  Medicare Wellness Visit Mode:: In-person (required for WTM) Information given by:: patient Interpreter Needed?: No Pre-visit prep was completed: no AWV questionnaire completed by patient prior to visit?: no Living arrangements:: in nursing facility Patient's Overall Health Status Rating: (!) fair Typical amount of pain: none Does pain affect daily life?: no Are you currently prescribed opioids?: no  Dietary Habits and Nutritional Risks How many meals a day?: 3 Eats fruit and vegetables daily?: yes Most meals are obtained by: having others provide food In the last 2 weeks, have you had any of the following?: (!) nausea, vomiting, diarrhea; unintentional  weight gain (has gained 10 lbs in the past month) Diabetic:: no  Functional Status Ambulation: Independent with device- listed below Home Assistive Devices/Equipment: Walker (specify Type) Medication Administration: Needs assistance (comment) Is this a change from baseline?: Pre-admission baseline Home Management: Needs assistance (comment) Manage your own finances?: yes Primary transportation is: family/friends; facility / other Concerns about vision?: no *vision screening is required for WTM* Concerns about hearing?: no  Fall Screening Falls in the past year?: 0 Number of falls in past year: 0 Was there an injury with Fall?: 0 Fall Risk Category Calculator: 0 Patient Fall Risk Level: Low Fall Risk  Fall Risk Patient at Risk for Falls Due to: Impaired balance/gait Fall risk Follow up: Falls evaluation completed  Home and Transportation Safety: All rugs have non-skid backing?: yes All stairs or steps have railings?: (!) no Grab bars in the bathtub or shower?: yes Have non-skid surface in bathtub or shower?: yes Good home lighting?: yes Regular seat belt use?: yes Hospital stays in the last year:: no  Cognitive Assessment Difficulty concentrating, remembering, or making decisions? : yes Will 6CIT or Mini Cog be Completed: yes What year is it?: 0 points What month is it?: 0 points Give patient an address phrase to remember (5 components): [address] [place]  About what time is it?: 0 points Count backwards from 20 to 1: 0 points Say the months of the year in reverse: 0 points Repeat the address phrase from earlier: 2 points 6 CIT Score: 2 points  Advance Directives (For Healthcare) Does Patient Have a Medical Advance Directive?: Yes Does patient want to make changes to medical advance directive?: No - Guardian declined Type of Advance Directive: Healthcare Power of [place]; Living will Copy of Healthcare Power of Attorney in Chart?: Yes - validated most  recent copy scanned in chart (See row information) Copy of Living Will in Chart?: Yes - validated most recent copy scanned in chart (See row information)  Reviewed/Updated  Reviewed/Updated: Reviewed All (Medical, Surgical, Family, Medications, Allergies, Care Teams, Patient Goals); Patient Goals        Objective:    Today's Vitals   09/03/24 1132  Weight: 203 lb (92.1 kg)   Body mass index is 29.13 kg/m.  Current Medications (verified) Outpatient Encounter Medications as of 09/03/2024  Medication Sig   Carboxymethylcellulose Sodium (ARTIFICIAL TEARS OP) Place 2 drops into both eyes 2 (two) times daily.   ferrous sulfate 325 (65 FE) MG EC tablet Take 325 mg by mouth every Monday, Wednesday, and Friday.   guaiFENesin-dextromethorphan (ROBITUSSIN DM) 100-10 MG/5ML syrup Take 10 mLs by mouth every 6 (six) hours as needed for cough.   LORazepam  (ATIVAN ) 0.5 MG tablet Take 1 tablet (0.5 mg total) by mouth daily.   mirtazapine (REMERON) 15 MG tablet Take 15 mg by mouth at bedtime.   pantoprazole (PROTONIX) 40 MG tablet Take 40 mg by mouth in  the morning.   polyethylene glycol (MIRALAX  / GLYCOLAX ) 17 g packet Take 17 g by mouth 2 (two) times daily.   Probiotic Product (PROBIOTIC GUMMIES PO) Take 2 each by mouth in the morning.   risperiDONE  (RISPERDAL ) 1 MG tablet Take 1 tablet (1 mg total) by mouth at bedtime.   sertraline  (ZOLOFT ) 100 MG tablet Take 200 mg by mouth at bedtime.   simvastatin  (ZOCOR ) 20 MG tablet Take 1 tablet (20 mg total) by mouth at bedtime.   tamsulosin  (FLOMAX ) 0.4 MG CAPS capsule Take 0.8 mg by mouth every evening.   [DISCONTINUED] bisacodyl  (DULCOLAX) 10 MG suppository Place 1 suppository (10 mg total) rectally as needed for moderate constipation. (Patient not taking: Reported on 09/03/2024)   No facility-administered encounter medications on file as of 09/03/2024.   Hearing/Vision screen No results found. Immunizations and Health Maintenance Health  Maintenance  Topic Date Due   COVID-19 Vaccine (3 - Moderna risk series) 09/19/2024 (Originally 08/30/2023)   Hepatitis C Screening  09/03/2025 (Originally 06/19/1964)   Medicare Annual Wellness (AWV)  09/03/2025   DTaP/Tdap/Td (4 - Td or Tdap) 11/24/2032   Influenza Vaccine  Completed   Zoster Vaccines- Shingrix  Completed   Meningococcal B Vaccine  Aged Out   Pneumococcal Vaccine: 50+ Years  Discontinued   Colonoscopy  Discontinued        Assessment/Plan:  This is a routine wellness examination for Kenneth Cunningham.  Patient Care Team: Charlanne Fredia CROME, MD as PCP - General (Internal Medicine)  I have personally reviewed and noted the following in the patient's chart:   Medical and social history Use of alcohol, tobacco or illicit drugs  Current medications and supplements including opioid prescriptions. Functional ability and status Nutritional status Physical activity Advanced directives List of other physicians Hospitalizations, surgeries, and ER visits in previous 12 months Vitals Screenings to include cognitive, depression, and falls Referrals and appointments  No orders of the defined types were placed in this encounter.  In addition, I have reviewed and discussed with patient certain preventive protocols, quality metrics, and best practice recommendations. A written personalized care plan for preventive services as well as general preventive health recommendations were provided to patient.   Tawni America, NP   09/03/2024   Return in 1 year (on 09/03/2025).  After Visit Summary: [facility]  Nurse Notes: PSA done at urology apt Recommend reducing carb and sugar intake Repeat BMP A1c prior to next apt in January

## 2024-09-03 NOTE — Patient Instructions (Signed)
 Mr. Bergeson , Thank you for taking time to come for your Medicare Wellness Visit. I appreciate your ongoing commitment to your health goals. Please review the following plan we discussed and let me know if I can assist you in the future.   Screening recommendations/referrals: Colonoscopy Needs apt with Dr Burnette with [facility] due to colonoscopy due in 2026 with hx of large polyp Recommended yearly ophthalmology/optometry visit for glaucoma screening and checkup Recommended yearly dental visit for hygiene and checkup  Vaccinations: Influenza vaccine due annually in September/October Pneumococcal vaccine UTD Tdap vaccine UTD Shingles vaccine UTD Declined covid vaccine     Advanced directives: reviewed   Conditions/risks identified: fall risk   Next appointment: 1 year   Preventive Care 22 Years and Older, Male Preventive care refers to lifestyle choices and visits with your health care provider that can promote health and wellness. What does preventive care include? A yearly physical exam. This is also called an annual well check. Dental exams once or twice a year. Routine eye exams. Ask your health care provider how often you should have your eyes checked. Personal lifestyle choices, including: Daily care of your teeth and gums. Regular physical activity. Eating a healthy diet. Avoiding tobacco and drug use. Limiting alcohol use. Practicing safe sex. Taking low doses of aspirin every day. Taking vitamin and mineral supplements as recommended by your health care provider. What happens during an annual well check? The services and screenings done by your health care provider during your annual well check will depend on your age, overall health, lifestyle risk factors, and family history of disease. Counseling  Your health care provider may ask you questions about your: Alcohol use. Tobacco use. Drug use. Emotional well-being. Home and relationship well-being. Sexual  activity. Eating habits. History of falls. Memory and ability to understand (cognition). Work and work astronomer. Screening  You may have the following tests or measurements: Height, weight, and BMI. Blood pressure. Lipid and cholesterol levels. These may be checked every 5 years, or more frequently if you are over 65 years old. Skin check. Lung cancer screening. You may have this screening every year starting at age 63 if you have a 30-pack-year history of smoking and currently smoke or have quit within the past 15 years. Fecal occult blood test (FOBT) of the stool. You may have this test every year starting at age 56. Flexible sigmoidoscopy or colonoscopy. You may have a sigmoidoscopy every 5 years or a colonoscopy every 10 years starting at age 22. Prostate cancer screening. Recommendations will vary depending on your family history and other risks. Hepatitis C blood test. Hepatitis B blood test. Sexually transmitted disease (STD) testing. Diabetes screening. This is done by checking your blood sugar (glucose) after you have not eaten for a while (fasting). You may have this done every 1-3 years. Abdominal aortic aneurysm (AAA) screening. You may need this if you are a current or former smoker. Osteoporosis. You may be screened starting at age 64 if you are at high risk. Talk with your health care provider about your test results, treatment options, and if necessary, the need for more tests. Vaccines  Your health care provider may recommend certain vaccines, such as: Influenza vaccine. This is recommended every year. Tetanus, diphtheria, and acellular pertussis (Tdap, Td) vaccine. You may need a Td booster every 10 years. Zoster vaccine. You may need this after age 44. Pneumococcal 13-valent conjugate (PCV13) vaccine. One dose is recommended after age 81. Pneumococcal polysaccharide (PPSV23) vaccine. One  dose is recommended after age 34. Talk to your health care provider about which  screenings and vaccines you need and how often you need them. This information is not intended to replace advice given to you by your health care provider. Make sure you discuss any questions you have with your health care provider. Document Released: 10/24/2015 Document Revised: 06/16/2016 Document Reviewed: 07/29/2015 Elsevier Interactive Patient Education  2017 [employer].  Fall Prevention in the Home Falls can cause injuries. They can happen to people of all ages. There are many things you can do to make your home safe and to help prevent falls. What can I do on the outside of my home? Regularly fix the edges of walkways and driveways and fix any cracks. Remove anything that might make you trip as you walk through a door, such as a raised step or threshold. Trim any bushes or trees on the path to your home. Use bright outdoor lighting. Clear any walking paths of anything that might make someone trip, such as rocks or tools. Regularly check to see if handrails are loose or broken. Make sure that both sides of any steps have handrails. Any raised decks and porches should have guardrails on the edges. Have any leaves, snow, or ice cleared regularly. Use sand or salt on walking paths during winter. Clean up any spills in your garage right away. This includes oil or grease spills. What can I do in the bathroom? Use night lights. Install grab bars by the toilet and in the tub and shower. Do not use towel bars as grab bars. Use non-skid mats or decals in the tub or shower. If you need to sit down in the shower, use a plastic, non-slip stool. Keep the floor dry. Clean up any water that spills on the floor as soon as it happens. Remove soap buildup in the tub or shower regularly. Attach bath mats securely with double-sided non-slip rug tape. Do not have throw rugs and other things on the floor that can make you trip. What can I do in the bedroom? Use night lights. Make sure that you have a  light by your bed that is easy to reach. Do not use any sheets or blankets that are too big for your bed. They should not hang down onto the floor. Have a firm chair that has side arms. You can use this for support while you get dressed. Do not have throw rugs and other things on the floor that can make you trip. What can I do in the kitchen? Clean up any spills right away. Avoid walking on wet floors. Keep items that you use a lot in easy-to-reach places. If you need to reach something above you, use a strong step stool that has a grab bar. Keep electrical cords out of the way. Do not use floor polish or wax that makes floors slippery. If you must use wax, use non-skid floor wax. Do not have throw rugs and other things on the floor that can make you trip. What can I do with my stairs? Do not leave any items on the stairs. Make sure that there are handrails on both sides of the stairs and use them. Fix handrails that are broken or loose. Make sure that handrails are as long as the stairways. Check any carpeting to make sure that it is firmly attached to the stairs. Fix any carpet that is loose or worn. Avoid having throw rugs at the top or bottom of the  stairs. If you do have throw rugs, attach them to the floor with carpet tape. Make sure that you have a light switch at the top of the stairs and the bottom of the stairs. If you do not have them, ask someone to add them for you. What else can I do to help prevent falls? Wear shoes that: Do not have high heels. Have rubber bottoms. Are comfortable and fit you well. Are closed at the toe. Do not wear sandals. If you use a stepladder: Make sure that it is fully opened. Do not climb a closed stepladder. Make sure that both sides of the stepladder are locked into place. Ask someone to hold it for you, if possible. Clearly mark and make sure that you can see: Any grab bars or handrails. First and last steps. Where the edge of each step  is. Use tools that help you move around (mobility aids) if they are needed. These include: Canes. Walkers. Scooters. Crutches. Turn on the lights when you go into a dark area. Replace any light bulbs as soon as they burn out. Set up your furniture so you have a clear path. Avoid moving your furniture around. If any of your floors are uneven, fix them. If there are any pets around you, be aware of where they are. Review your medicines with your doctor. Some medicines can make you feel dizzy. This can increase your chance of falling. Ask your doctor what other things that you can do to help prevent falls. This information is not intended to replace advice given to you by your health care provider. Make sure you discuss any questions you have with your health care provider. Document Released: 07/24/2009 Document Revised: 03/04/2016 Document Reviewed: 11/01/2014 Elsevier Interactive Patient Education  2017 [employer].  Mr. Brockman,  Thank you for taking the time for your Medicare Wellness Visit. I appreciate your continued commitment to your health goals. Please review the care plan we discussed, and feel free to reach out if I can assist you further.  Please note that Annual Wellness Visits do not include a physical exam. Some assessments may be limited, especially if the visit was conducted virtually. If needed, we may recommend an in-person follow-up with your provider.  Ongoing Care Seeing your primary care provider every 3 to 6 months helps us  monitor your health and provide consistent, personalized care. Dr Charlanne and Bari [place] NP  Referrals If a referral was made during today's visit and you haven't received any updates within two weeks, please contact the referred provider directly to check on the status.  Recommended Screenings:  Health Maintenance  Topic Date Due   COVID-19 Vaccine (3 - Moderna risk series) 09/19/2024*   Hepatitis C Screening  09/03/2025*   Medicare Annual  Wellness Visit  09/03/2025   DTaP/Tdap/Td vaccine (4 - Td or Tdap) 11/24/2032   Flu Shot  Completed   Zoster (Shingles) Vaccine  Completed   Meningitis B Vaccine  Aged Out   Pneumococcal Vaccine for age over 11  Discontinued   Colon Cancer Screening  Discontinued  *Topic was postponed. The date shown is not the original due date.       09/03/2024   11:38 AM  Advanced Directives  Does Patient Have a Medical Advance Directive? Yes  Type of Estate Agent of [place];Living will  Does patient want to make changes to medical advance directive? No - Guardian declined    Vision: Annual vision screenings are recommended for early detection  of glaucoma, cataracts, and diabetic retinopathy. These exams can also reveal signs of chronic conditions such as diabetes and high blood pressure.  Dental: Annual dental screenings help detect early signs of oral cancer, gum disease, and other conditions linked to overall health, including heart disease and diabetes.  Please see the attached documents for additional preventive care recommendations.

## 2024-09-04 NOTE — Progress Notes (Signed)
 This encounter was created in error - please disregard.

## 2024-11-01 LAB — BASIC METABOLIC PANEL WITH GFR
BUN: 11 (ref 4–21)
CO2: 26 — AB (ref 13–22)
Chloride: 95 — AB (ref 99–108)
Creatinine: 1.1 (ref 0.6–1.3)
Glucose: 88
Potassium: 4.5 meq/L (ref 3.5–5.1)
Sodium: 133 — AB (ref 137–147)

## 2024-11-01 LAB — COMPREHENSIVE METABOLIC PANEL WITH GFR
Calcium: 8.8 (ref 8.7–10.7)
eGFR: 67

## 2024-11-01 LAB — HEMOGLOBIN A1C: Hemoglobin A1C: 5.6

## 2024-11-05 ENCOUNTER — Encounter: Admitting: Adult Health

## 2024-11-13 ENCOUNTER — Encounter: Payer: Self-pay | Admitting: Internal Medicine

## 2024-11-13 ENCOUNTER — Non-Acute Institutional Stay: Admitting: Internal Medicine

## 2024-11-13 VITALS — BP 124/88 | HR 83 | Temp 98.3°F | Wt 200.0 lb

## 2024-11-13 DIAGNOSIS — F429 Obsessive-compulsive disorder, unspecified: Secondary | ICD-10-CM | POA: Diagnosis not present

## 2024-11-13 DIAGNOSIS — D509 Iron deficiency anemia, unspecified: Secondary | ICD-10-CM | POA: Diagnosis not present

## 2024-11-13 DIAGNOSIS — N401 Enlarged prostate with lower urinary tract symptoms: Secondary | ICD-10-CM

## 2024-11-13 DIAGNOSIS — E782 Mixed hyperlipidemia: Secondary | ICD-10-CM

## 2024-11-13 DIAGNOSIS — R3912 Poor urinary stream: Secondary | ICD-10-CM | POA: Diagnosis not present

## 2024-11-13 DIAGNOSIS — K5901 Slow transit constipation: Secondary | ICD-10-CM

## 2024-11-13 MED ORDER — SIMVASTATIN 40 MG PO TABS: 40.0000 mg | ORAL_TABLET | Freq: Every day | ORAL | Status: AC

## 2025-02-11 ENCOUNTER — Encounter: Admitting: Adult Health
